# Patient Record
Sex: Male | Born: 1950 | Race: Black or African American | Hispanic: No
Health system: Southern US, Community
[De-identification: ages and names within clinical notes are randomized; demographics above are authoritative.]

---

## 2005-08-20 ENCOUNTER — Emergency Department: Payer: Self-pay | Admitting: Emergency Medicine

## 2005-08-25 ENCOUNTER — Emergency Department: Payer: Self-pay | Admitting: Emergency Medicine

## 2007-06-01 ENCOUNTER — Emergency Department: Payer: Self-pay | Admitting: Emergency Medicine

## 2007-12-24 ENCOUNTER — Emergency Department (HOSPITAL_COMMUNITY): Admission: EM | Admit: 2007-12-24 | Discharge: 2007-12-24 | Payer: Self-pay | Admitting: Emergency Medicine

## 2008-01-03 ENCOUNTER — Emergency Department (HOSPITAL_COMMUNITY): Admission: EM | Admit: 2008-01-03 | Discharge: 2008-01-03 | Payer: Self-pay | Admitting: Family Medicine

## 2008-01-06 ENCOUNTER — Emergency Department (HOSPITAL_COMMUNITY): Admission: EM | Admit: 2008-01-06 | Discharge: 2008-01-06 | Payer: Self-pay | Admitting: Family Medicine

## 2008-01-09 ENCOUNTER — Emergency Department (HOSPITAL_COMMUNITY): Admission: EM | Admit: 2008-01-09 | Discharge: 2008-01-09 | Payer: Self-pay | Admitting: Family Medicine

## 2008-01-11 ENCOUNTER — Emergency Department (HOSPITAL_COMMUNITY): Admission: EM | Admit: 2008-01-11 | Discharge: 2008-01-11 | Payer: Self-pay | Admitting: Family Medicine

## 2010-08-15 ENCOUNTER — Inpatient Hospital Stay: Payer: Self-pay | Admitting: Specialist

## 2010-08-25 ENCOUNTER — Observation Stay: Payer: Self-pay | Admitting: Internal Medicine

## 2010-09-07 ENCOUNTER — Observation Stay: Payer: Self-pay | Admitting: Cardiology

## 2010-11-16 ENCOUNTER — Observation Stay: Payer: Self-pay | Admitting: Internal Medicine

## 2011-07-14 ENCOUNTER — Emergency Department: Payer: Self-pay | Admitting: Emergency Medicine

## 2011-12-13 ENCOUNTER — Emergency Department: Payer: Self-pay | Admitting: Emergency Medicine

## 2011-12-13 LAB — COMPREHENSIVE METABOLIC PANEL
Albumin: 2.3 g/dL — ABNORMAL LOW (ref 3.4–5.0)
Alkaline Phosphatase: 147 U/L — ABNORMAL HIGH (ref 50–136)
Anion Gap: 7 (ref 7–16)
BUN: 1 mg/dL — ABNORMAL LOW (ref 7–18)
Calcium, Total: 8.6 mg/dL (ref 8.5–10.1)
Creatinine: 0.8 mg/dL (ref 0.60–1.30)
EGFR (Non-African Amer.): >60
Glucose: 104 mg/dL — ABNORMAL HIGH (ref 65–99)
Osmolality: 264 (ref 275–301)
Potassium: 3.5 mmol/L (ref 3.5–5.1)
Sodium: 134 mmol/L — ABNORMAL LOW (ref 136–145)
Total Protein: 10.4 g/dL — ABNORMAL HIGH (ref 6.4–8.2)

## 2011-12-13 LAB — CBC
HCT: 41.9 % (ref 40.0–52.0)
HGB: 14.4 g/dL (ref 13.0–18.0)
MCHC: 34.3 g/dL (ref 32.0–36.0)
RBC: 3.86 10*6/uL — ABNORMAL LOW (ref 4.40–5.90)

## 2011-12-13 LAB — URINALYSIS, COMPLETE
Nitrite: NEGATIVE
Ph: 6 (ref 4.5–8.0)
Protein: NEGATIVE
Specific Gravity: 1.057 (ref 1.003–1.030)
WBC UR: 4 /HPF (ref 0–5)

## 2011-12-16 ENCOUNTER — Ambulatory Visit: Payer: Self-pay | Admitting: Gastroenterology

## 2012-01-28 IMAGING — CR DG CHEST 1V PORT
1 series · 1 of 1 positions shown · non-contrast
Comparison: none

REASON FOR EXAM: chest pain
COMMENTS:

[view not recorded]
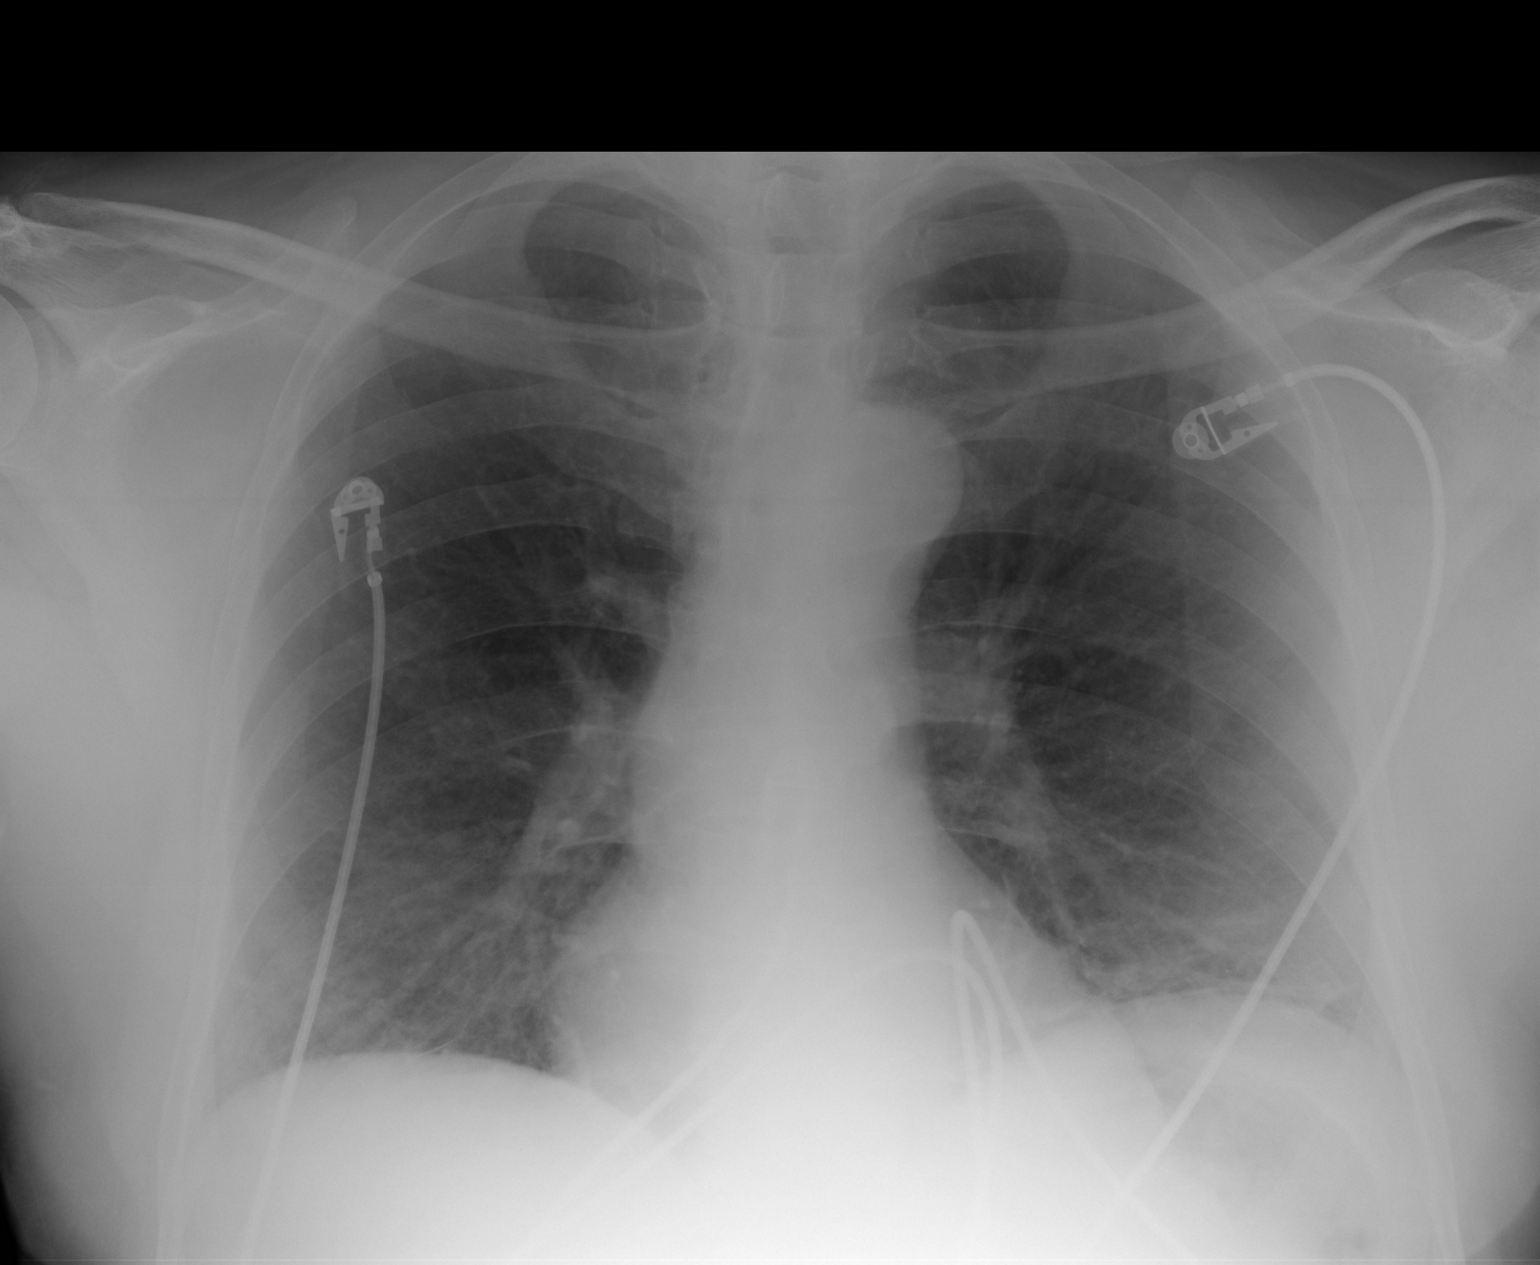

[1 of 1 positions shown; findings below may reference images not displayed]

PROCEDURE:     DXR - DXR PORTABLE CHEST SINGLE VIEW  - August 25, 2010  [DATE]

RESULT:     Comparison is made to the prior exam of 08/15/2010. There is
again noted a slight increase in density at the left base, consistent with
left basilar atelectasis. The lung fields otherwise are clear. Heart size is
normal. No pulmonary edema or pleural effusion is seen. Monitoring
electrodes are present.
IMPRESSION: There is minimal left basilar atelectasis. The lung fields otherwise are
clear.

## 2012-02-10 IMAGING — CR DG CHEST 2V
1 series · 3 of 3 positions shown · non-contrast
Comparison: none

REASON FOR EXAM: chest pain
COMMENTS:

[Series 1: view not recorded · 0.17mm/px · 3 of 3 slices shown]
[im 1/3]
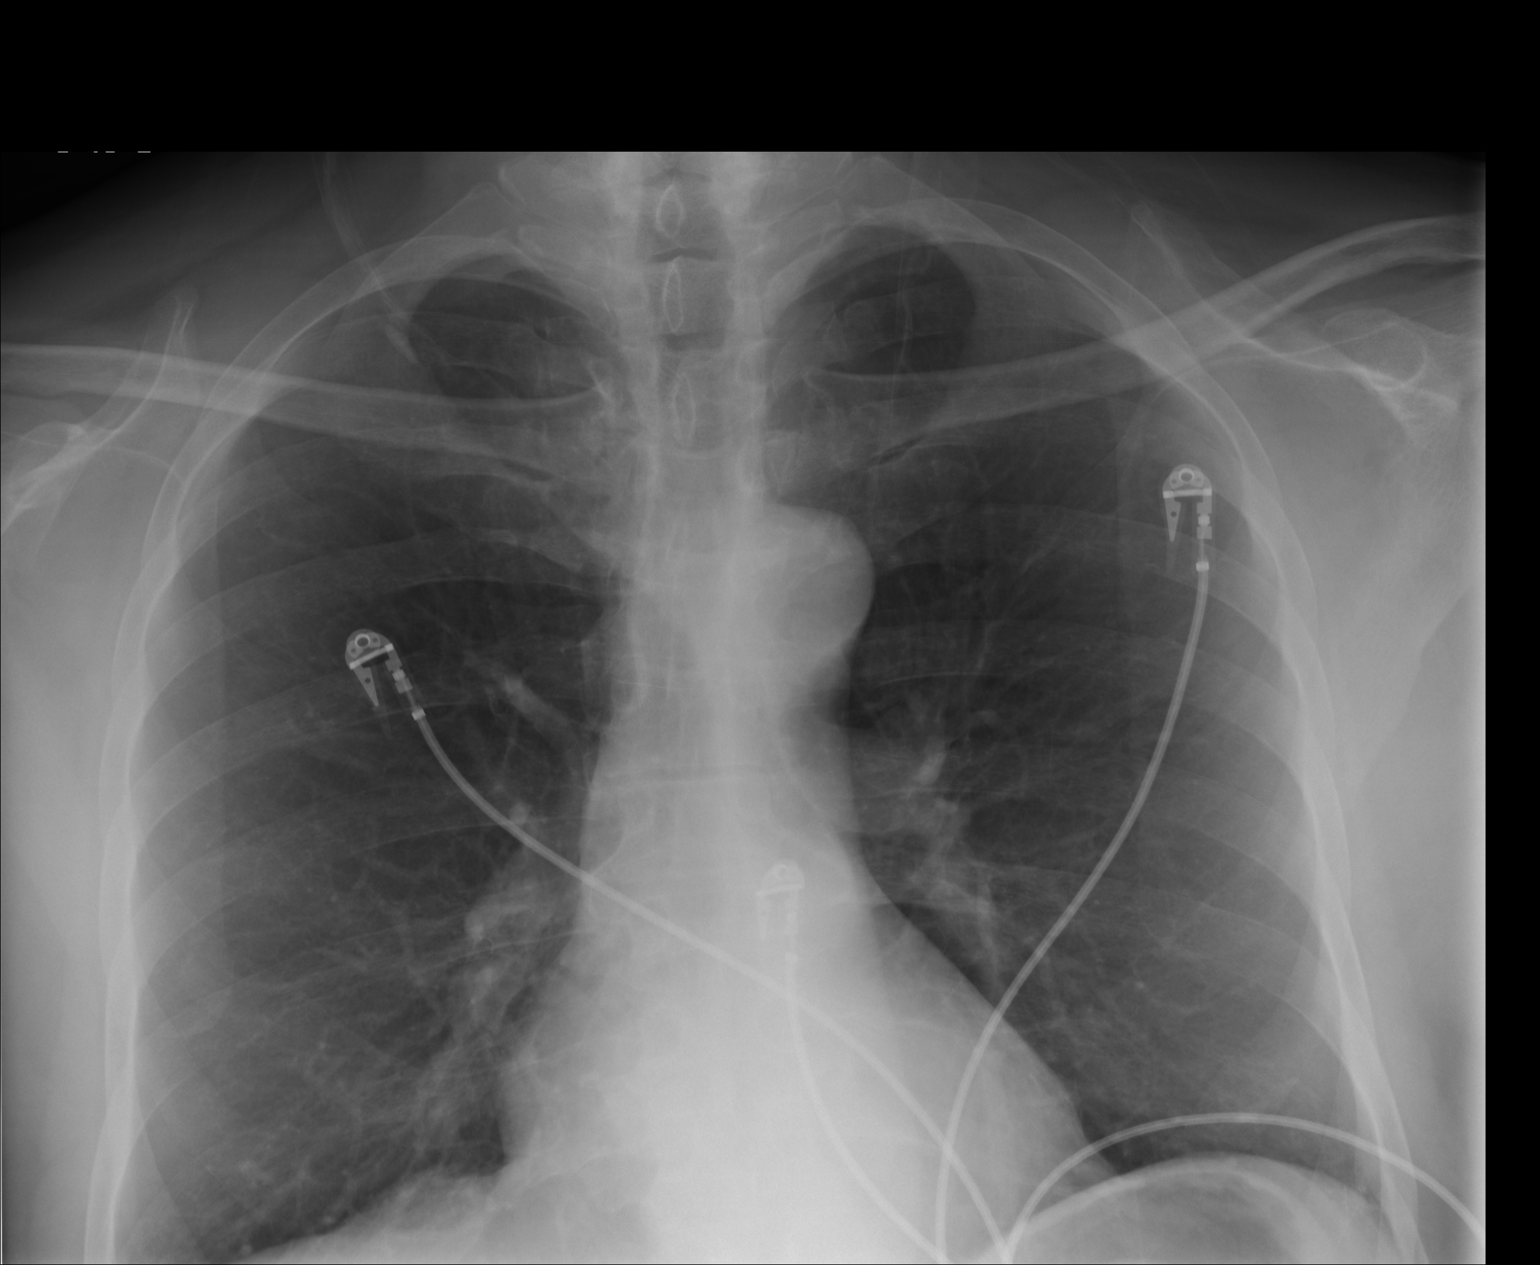
[im 2/3]
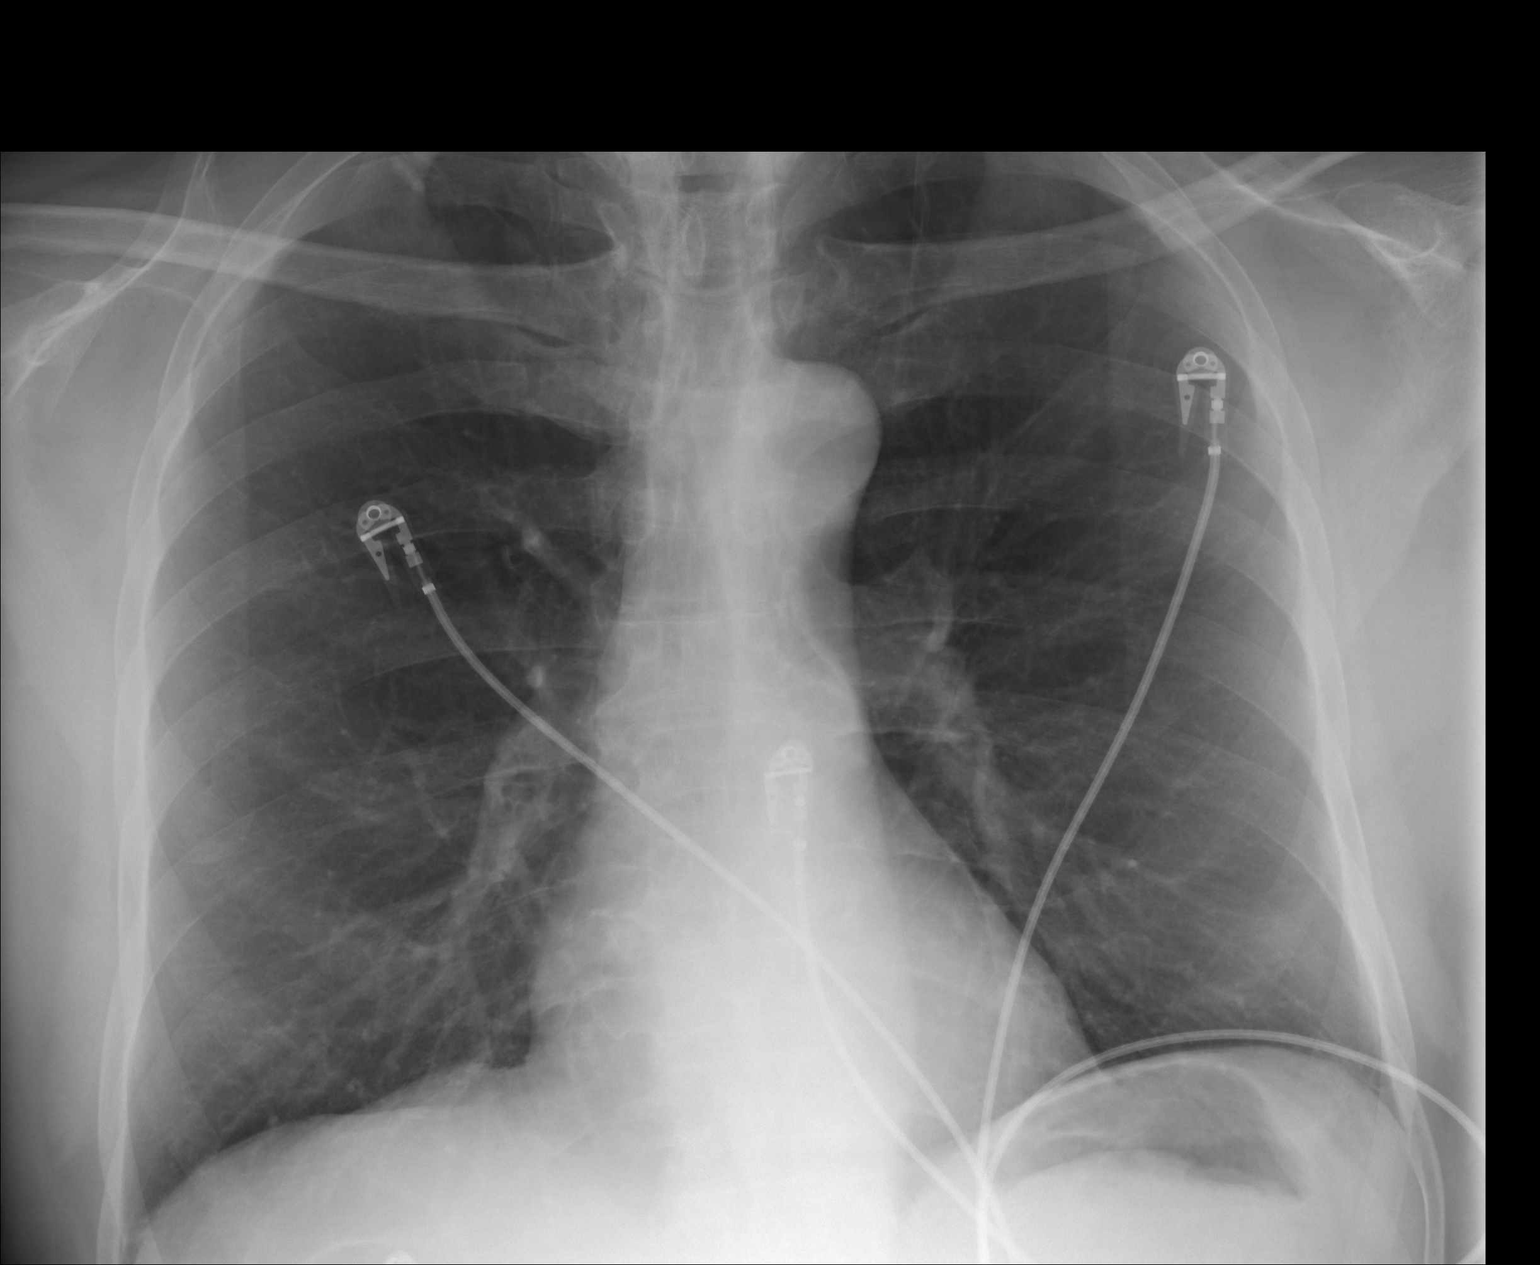
[im 3/3]
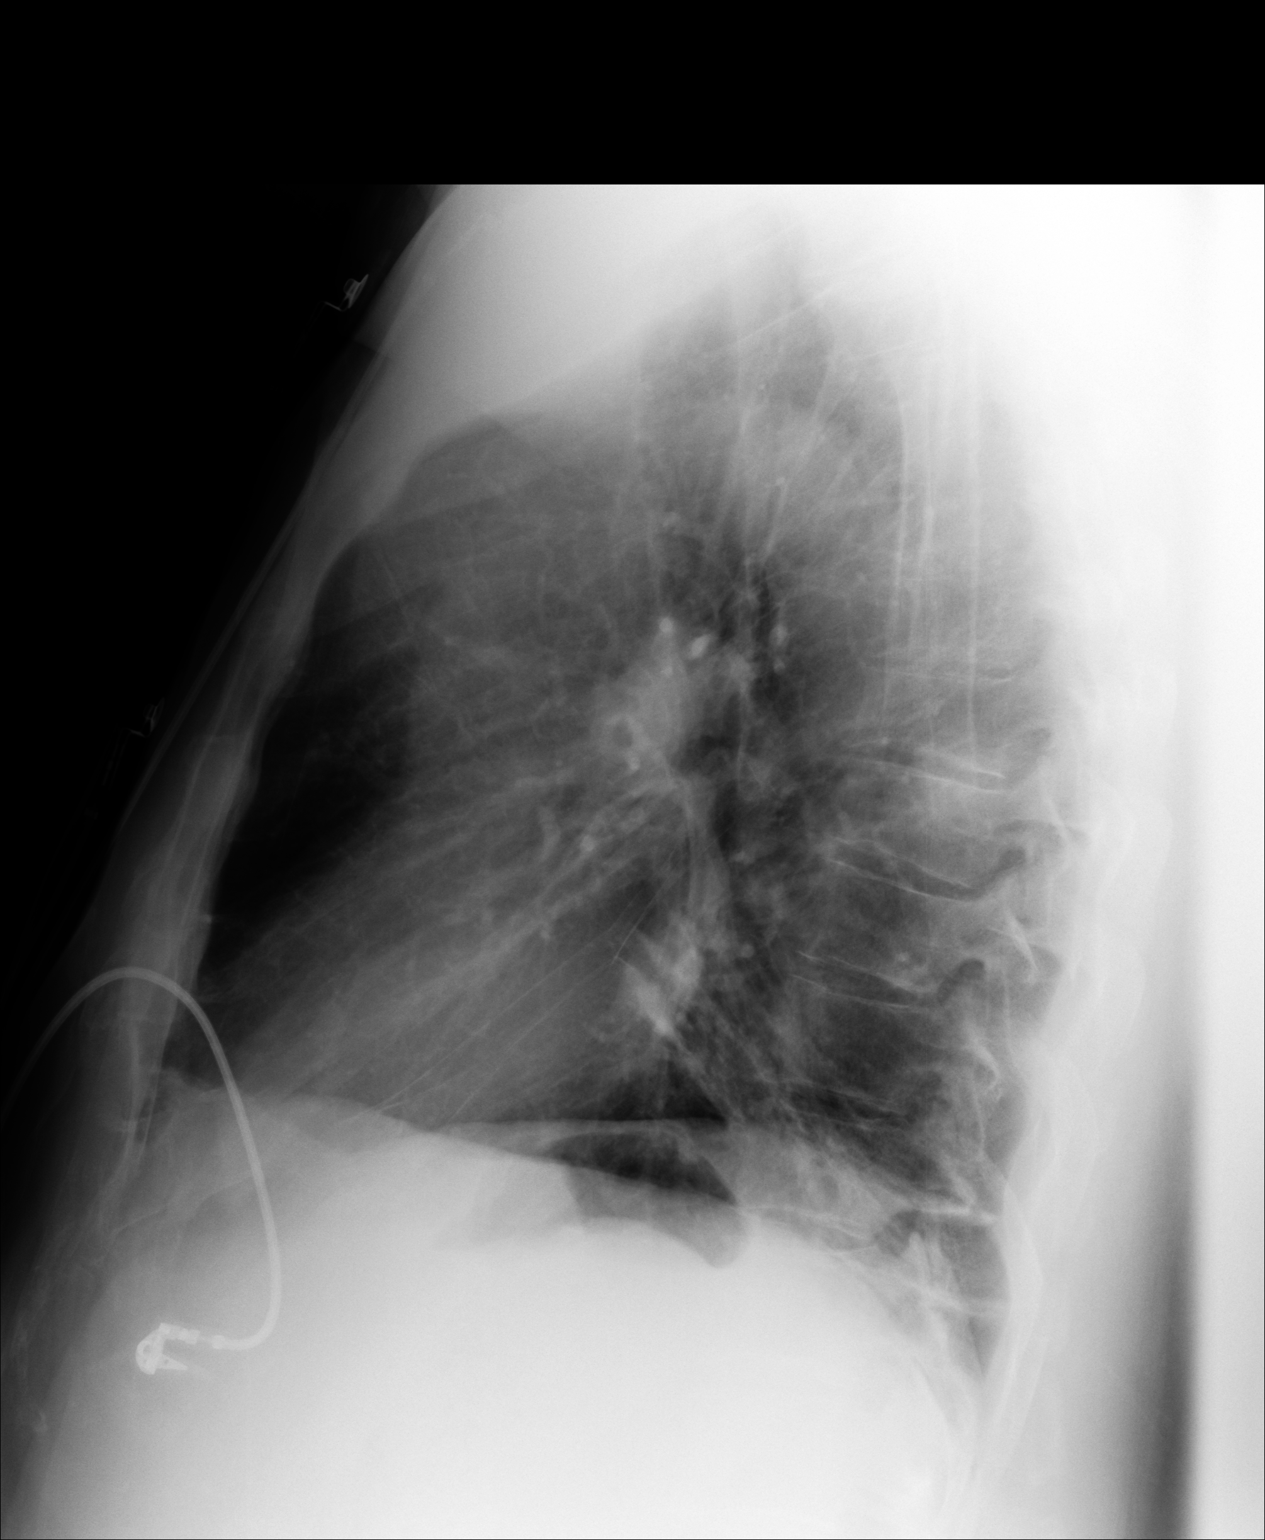

[3 of 3 positions shown; findings below may reference images not displayed]

PROCEDURE:     DXR - DXR CHEST PA (OR AP) AND LATERAL  - September 07, 2010  [DATE]

RESULT:     Comparison is made to the study 25 August, 2010.

The lungs are well-expanded. There are coarse lung markings in the
retrocardiac region on the lateral film. These are difficult to triangulate
on the frontal film. The cardiac silhouette is normal in size. The pulmonary
vascularity is not engorged. The mediastinum is normal in width.
IMPRESSION: 1. I do not see evidence of CHF.
2. There is atelectasis versus scarring in the retro-cardiac region which
was shown to be predominantly left-sided on the CT scan dated 25 August, 2010.

## 2012-02-21 ENCOUNTER — Inpatient Hospital Stay: Payer: Self-pay | Admitting: Internal Medicine

## 2012-02-21 LAB — COMPREHENSIVE METABOLIC PANEL
Alkaline Phosphatase: 104 U/L (ref 50–136)
Anion Gap: 11 (ref 7–16)
Bilirubin,Total: 4.2 mg/dL — ABNORMAL HIGH (ref 0.2–1.0)
Calcium, Total: 8.5 mg/dL (ref 8.5–10.1)
Chloride: 97 mmol/L — ABNORMAL LOW (ref 98–107)
Co2: 21 mmol/L (ref 21–32)
Creatinine: 0.9 mg/dL (ref 0.60–1.30)
EGFR (Non-African Amer.): >60
Osmolality: 259 (ref 275–301)
Potassium: 4.2 mmol/L (ref 3.5–5.1)
Sodium: 129 mmol/L — ABNORMAL LOW (ref 136–145)

## 2012-02-21 LAB — PROTIME-INR: Prothrombin Time: 28 secs — ABNORMAL HIGH (ref 11.5–14.7)

## 2012-02-21 LAB — APTT: Activated PTT: 55.8 secs — ABNORMAL HIGH (ref 23.6–35.9)

## 2012-02-21 LAB — CBC
HCT: 31.4 % — ABNORMAL LOW (ref 40.0–52.0)
HGB: 10.8 g/dL — ABNORMAL LOW (ref 13.0–18.0)
MCV: 107 fL — ABNORMAL HIGH (ref 80–100)
RBC: 2.93 10*6/uL — ABNORMAL LOW (ref 4.40–5.90)

## 2012-02-21 LAB — AMMONIA: Ammonia, Plasma: 29 mcmol/L (ref 11–32)

## 2012-02-21 LAB — CK TOTAL AND CKMB (NOT AT ARMC): CK, Total: 20 U/L — ABNORMAL LOW (ref 35–232)

## 2012-02-22 LAB — CBC WITH DIFFERENTIAL/PLATELET
Comment - H1-Com2: NORMAL
Eosinophil %: 0 %
HCT: 23.6 % — ABNORMAL LOW (ref 40.0–52.0)
HGB: 8.3 g/dL — ABNORMAL LOW (ref 13.0–18.0)
Lymphocyte #: 2.4 10*3/uL (ref 1.0–3.6)
Lymphocytes: 4 %
MCH: 37.1 pg — ABNORMAL HIGH (ref 26.0–34.0)
MCH: 36 pg — ABNORMAL HIGH (ref 26.0–34.0)
MCHC: 34.6 g/dL (ref 32.0–36.0)
Monocyte #: 1.4 x10 3/mm — ABNORMAL HIGH (ref 0.2–1.0)
Neutrophil #: 15 10*3/uL — ABNORMAL HIGH (ref 1.4–6.5)
Platelet: 270 10*3/uL (ref 150–440)
RDW: 14.4 % (ref 11.5–14.5)
WBC: 19 10*3/uL — ABNORMAL HIGH (ref 3.8–10.6)

## 2012-02-22 LAB — URINALYSIS, COMPLETE
Bilirubin,UR: NEGATIVE
Blood: NEGATIVE
Glucose,UR: NEGATIVE mg/dL (ref 0–75)
Specific Gravity: 1.032 (ref 1.003–1.030)

## 2012-02-22 LAB — BASIC METABOLIC PANEL
Anion Gap: 9 (ref 7–16)
Creatinine: 0.94 mg/dL (ref 0.60–1.30)
EGFR (African American): >60
EGFR (Non-African Amer.): >60

## 2012-02-24 ENCOUNTER — Ambulatory Visit: Payer: Self-pay | Admitting: Internal Medicine

## 2012-02-24 LAB — BASIC METABOLIC PANEL
BUN: 13 mg/dL (ref 7–18)
EGFR (African American): >60
EGFR (Non-African Amer.): >60
Glucose: 115 mg/dL — ABNORMAL HIGH (ref 65–99)
Osmolality: 262 (ref 275–301)
Potassium: 4.1 mmol/L (ref 3.5–5.1)

## 2012-02-24 LAB — CBC WITH DIFFERENTIAL/PLATELET
Basophil #: 0.1 10*3/uL (ref 0.0–0.1)
Eosinophil #: 0 10*3/uL (ref 0.0–0.7)
HGB: 5.6 g/dL — ABNORMAL LOW (ref 13.0–18.0)
Lymphocyte %: 19.9 %
MCH: 35.3 pg — ABNORMAL HIGH (ref 26.0–34.0)
Neutrophil #: 15.5 10*3/uL — ABNORMAL HIGH (ref 1.4–6.5)
Neutrophil %: 68.5 %
RBC: 1.6 10*6/uL — ABNORMAL LOW (ref 4.40–5.90)
WBC: 22.6 10*3/uL — ABNORMAL HIGH (ref 3.8–10.6)

## 2012-02-25 LAB — CBC WITH DIFFERENTIAL/PLATELET
Basophil #: 0.2 10*3/uL — ABNORMAL HIGH (ref 0.0–0.1)
Basophil %: 1.1 %
Eosinophil #: 0.1 10*3/uL (ref 0.0–0.7)
HCT: 21.9 % — ABNORMAL LOW (ref 40.0–52.0)
HGB: 7.7 g/dL — ABNORMAL LOW (ref 13.0–18.0)
Lymphocyte #: 4.2 10*3/uL — ABNORMAL HIGH (ref 1.0–3.6)
Lymphocyte %: 19.5 %
MCH: 35 pg — ABNORMAL HIGH (ref 26.0–34.0)
MCHC: 35.2 g/dL (ref 32.0–36.0)
MCV: 99 fL (ref 80–100)
Neutrophil #: 14.2 10*3/uL — ABNORMAL HIGH (ref 1.4–6.5)
RBC: 2.2 10*6/uL — ABNORMAL LOW (ref 4.40–5.90)
RDW: 18.3 % — ABNORMAL HIGH (ref 11.5–14.5)
WBC: 21.6 10*3/uL — ABNORMAL HIGH (ref 3.8–10.6)

## 2012-02-28 LAB — BASIC METABOLIC PANEL
BUN: 5 mg/dL — ABNORMAL LOW (ref 7–18)
Creatinine: 0.62 mg/dL (ref 0.60–1.30)
EGFR (African American): >60
EGFR (Non-African Amer.): >60
Glucose: 95 mg/dL (ref 65–99)
Sodium: 124 mmol/L — ABNORMAL LOW (ref 136–145)

## 2012-02-28 LAB — CBC WITH DIFFERENTIAL/PLATELET
Basophil: 1 %
Eosinophil: 2 %
HCT: 22.6 % — ABNORMAL LOW (ref 40.0–52.0)
HGB: 8.3 g/dL — ABNORMAL LOW (ref 13.0–18.0)
MCH: 36.7 pg — ABNORMAL HIGH (ref 26.0–34.0)
MCH: 36.3 pg — ABNORMAL HIGH (ref 26.0–34.0)
MCHC: 35.3 g/dL (ref 32.0–36.0)
MCHC: 34.1 g/dL (ref 32.0–36.0)
MCV: 104 fL — ABNORMAL HIGH (ref 80–100)
Monocytes: 6 %
Monocytes: 5 %
Myelocyte: 2 %
Platelet: 189 10*3/uL (ref 150–440)
Platelet: 208 10*3/uL (ref 150–440)
RDW: 18.1 % — ABNORMAL HIGH (ref 11.5–14.5)
RDW: 19.7 % — ABNORMAL HIGH (ref 11.5–14.5)
WBC: 17.1 10*3/uL — ABNORMAL HIGH (ref 3.8–10.6)

## 2012-02-28 LAB — COMPREHENSIVE METABOLIC PANEL
BUN: 6 mg/dL — ABNORMAL LOW (ref 7–18)
Bilirubin,Total: 4.8 mg/dL — ABNORMAL HIGH (ref 0.2–1.0)
Chloride: 94 mmol/L — ABNORMAL LOW (ref 98–107)
Creatinine: 0.63 mg/dL (ref 0.60–1.30)
EGFR (African American): >60
Osmolality: 250 (ref 275–301)
SGPT (ALT): 21 U/L (ref 12–78)
Sodium: 126 mmol/L — ABNORMAL LOW (ref 136–145)
Total Protein: 7.9 g/dL (ref 6.4–8.2)

## 2012-02-28 LAB — HEPATIC FUNCTION PANEL A (ARMC)
Albumin: 1.5 g/dL — ABNORMAL LOW (ref 3.4–5.0)
Alkaline Phosphatase: 97 U/L (ref 50–136)
Bilirubin, Direct: 1.8 mg/dL — ABNORMAL HIGH (ref 0.00–0.20)
Bilirubin,Total: 4.9 mg/dL — ABNORMAL HIGH (ref 0.2–1.0)
SGPT (ALT): 20 U/L (ref 12–78)

## 2012-02-28 LAB — UR PROT ELECTROPHORESIS, URINE RANDOM

## 2012-02-28 LAB — PROTIME-INR: INR: 2.5

## 2012-03-18 ENCOUNTER — Ambulatory Visit: Payer: Self-pay | Admitting: Internal Medicine

## 2012-05-12 ENCOUNTER — Inpatient Hospital Stay: Payer: Self-pay | Admitting: Internal Medicine

## 2012-05-12 LAB — URINALYSIS, COMPLETE
Blood: NEGATIVE
Nitrite: POSITIVE

## 2012-05-12 LAB — COMPREHENSIVE METABOLIC PANEL
BUN: 6 mg/dL — ABNORMAL LOW (ref 7–18)
Bilirubin,Total: 2 mg/dL — ABNORMAL HIGH (ref 0.2–1.0)
Co2: 23 mmol/L (ref 21–32)
EGFR (Non-African Amer.): >60
Total Protein: 10 g/dL — ABNORMAL HIGH (ref 6.4–8.2)

## 2012-05-12 LAB — LIPASE, BLOOD: Lipase: 383 U/L (ref 73–393)

## 2012-05-12 LAB — CBC WITH DIFFERENTIAL/PLATELET
HGB: 11 g/dL — ABNORMAL LOW (ref 13.0–18.0)
Lymphocyte %: 37.2 %
MCV: 102 fL — ABNORMAL HIGH (ref 80–100)
Monocyte %: 11 %
WBC: 6.7 10*3/uL (ref 3.8–10.6)

## 2012-05-12 LAB — CBC
HCT: 37.2 % — ABNORMAL LOW (ref 40.0–52.0)
HGB: 12.9 g/dL — ABNORMAL LOW (ref 13.0–18.0)
MCHC: 34.8 g/dL (ref 32.0–36.0)
MCV: 102 fL — ABNORMAL HIGH (ref 80–100)
RBC: 3.66 10*6/uL — ABNORMAL LOW (ref 4.40–5.90)
WBC: 6.8 10*3/uL (ref 3.8–10.6)

## 2012-05-13 LAB — CBC WITH DIFFERENTIAL/PLATELET
Basophil %: 1 %
Lymphocyte #: 2.3 10*3/uL (ref 1.0–3.6)
Lymphocyte %: 36.2 %
Lymphocytes: 32 %
MCHC: 35.1 g/dL (ref 32.0–36.0)
MCHC: 35.1 g/dL (ref 32.0–36.0)
MCV: 102 fL — ABNORMAL HIGH (ref 80–100)
Neutrophil #: 3.1 10*3/uL (ref 1.4–6.5)
Neutrophil %: 48.2 %
Platelet: 180 10*3/uL (ref 150–440)
RBC: 2.87 10*6/uL — ABNORMAL LOW (ref 4.40–5.90)
RBC: 3.05 10*6/uL — ABNORMAL LOW (ref 4.40–5.90)
WBC: 6.5 10*3/uL (ref 3.8–10.6)

## 2012-05-13 LAB — BASIC METABOLIC PANEL
Anion Gap: 6 — ABNORMAL LOW (ref 7–16)
Calcium, Total: 7.5 mg/dL — ABNORMAL LOW (ref 8.5–10.1)
Chloride: 103 mmol/L (ref 98–107)
Co2: 22 mmol/L (ref 21–32)
Creatinine: 0.81 mg/dL (ref 0.60–1.30)
EGFR (Non-African Amer.): >60
Osmolality: 261 (ref 275–301)

## 2012-05-14 LAB — CBC WITH DIFFERENTIAL/PLATELET
Eosinophil #: 0.1 10*3/uL (ref 0.0–0.7)
HCT: 28.4 % — ABNORMAL LOW (ref 40.0–52.0)
HGB: 9.9 g/dL — ABNORMAL LOW (ref 13.0–18.0)
Lymphocyte #: 2.3 10*3/uL (ref 1.0–3.6)
MCH: 35.1 pg — ABNORMAL HIGH (ref 26.0–34.0)
MCHC: 34.7 g/dL (ref 32.0–36.0)
MCV: 101 fL — ABNORMAL HIGH (ref 80–100)
WBC: 7.1 10*3/uL (ref 3.8–10.6)

## 2012-07-17 ENCOUNTER — Ambulatory Visit: Payer: Self-pay | Admitting: Internal Medicine

## 2012-08-04 ENCOUNTER — Emergency Department: Payer: Self-pay | Admitting: Internal Medicine

## 2012-08-04 LAB — COMPREHENSIVE METABOLIC PANEL
Albumin: 1.4 g/dL — ABNORMAL LOW (ref 3.4–5.0)
Bilirubin,Total: 1.6 mg/dL — ABNORMAL HIGH (ref 0.2–1.0)
Calcium, Total: 6.7 mg/dL — CL (ref 8.5–10.1)
Co2: 22 mmol/L (ref 21–32)
Creatinine: 0.92 mg/dL (ref 0.60–1.30)
EGFR (African American): >60
EGFR (Non-African Amer.): >60
Glucose: 122 mg/dL — ABNORMAL HIGH (ref 65–99)
Osmolality: 263 (ref 275–301)
Potassium: 4.1 mmol/L (ref 3.5–5.1)
SGPT (ALT): 28 U/L (ref 12–78)
Sodium: 131 mmol/L — ABNORMAL LOW (ref 136–145)
Total Protein: 8.2 g/dL (ref 6.4–8.2)

## 2012-08-04 LAB — PROTIME-INR: Prothrombin Time: 21.2 secs — ABNORMAL HIGH (ref 11.5–14.7)

## 2012-08-04 LAB — CBC
HCT: 29.9 % — ABNORMAL LOW (ref 40.0–52.0)
HGB: 10.5 g/dL — ABNORMAL LOW (ref 13.0–18.0)
MCH: 36.4 pg — ABNORMAL HIGH (ref 26.0–34.0)
MCHC: 34.9 g/dL (ref 32.0–36.0)
MCV: 104 fL — ABNORMAL HIGH (ref 80–100)
WBC: 6.9 10*3/uL (ref 3.8–10.6)

## 2012-08-08 ENCOUNTER — Inpatient Hospital Stay: Payer: Self-pay | Admitting: Internal Medicine

## 2012-08-08 LAB — CBC
HCT: 27.1 % — ABNORMAL LOW (ref 40.0–52.0)
HGB: 9 g/dL — ABNORMAL LOW (ref 13.0–18.0)
MCH: 34.6 pg — ABNORMAL HIGH (ref 26.0–34.0)
MCHC: 33.2 g/dL (ref 32.0–36.0)
MCV: 104 fL — ABNORMAL HIGH (ref 80–100)
RBC: 2.59 10*6/uL — ABNORMAL LOW (ref 4.40–5.90)

## 2012-08-08 LAB — HEMOGLOBIN: HGB: 9.7 g/dL — ABNORMAL LOW (ref 13.0–18.0)

## 2012-08-08 LAB — COMPREHENSIVE METABOLIC PANEL
Albumin: 1.4 g/dL — ABNORMAL LOW (ref 3.4–5.0)
Alkaline Phosphatase: 91 U/L (ref 50–136)
BUN: 10 mg/dL (ref 7–18)
Calcium, Total: 7.4 mg/dL — ABNORMAL LOW (ref 8.5–10.1)
Chloride: 102 mmol/L (ref 98–107)
Creatinine: 0.96 mg/dL (ref 0.60–1.30)
Osmolality: 261 (ref 275–301)
Potassium: 4.4 mmol/L (ref 3.5–5.1)
Sodium: 130 mmol/L — ABNORMAL LOW (ref 136–145)
Total Protein: 7.7 g/dL (ref 6.4–8.2)

## 2012-08-08 LAB — APTT: Activated PTT: 49.6 secs — ABNORMAL HIGH (ref 23.6–35.9)

## 2012-08-08 LAB — PROTIME-INR
INR: 2.3
Prothrombin Time: 24.9 secs — ABNORMAL HIGH (ref 11.5–14.7)

## 2012-08-09 LAB — CBC WITH DIFFERENTIAL/PLATELET
Basophil: 1 %
Eosinophil: 1 %
HCT: 25.7 % — ABNORMAL LOW (ref 40.0–52.0)
HGB: 8.9 g/dL — ABNORMAL LOW (ref 13.0–18.0)
Lymphocytes: 13 %
MCH: 36 pg — ABNORMAL HIGH (ref 26.0–34.0)
MCV: 104 fL — ABNORMAL HIGH (ref 80–100)
Monocytes: 8 %
Myelocyte: 1 %
RBC: 2.46 10*6/uL — ABNORMAL LOW (ref 4.40–5.90)
RDW: 15.4 % — ABNORMAL HIGH (ref 11.5–14.5)
Segmented Neutrophils: 75 %

## 2012-08-09 LAB — BASIC METABOLIC PANEL
Anion Gap: 7 (ref 7–16)
Chloride: 102 mmol/L (ref 98–107)
Co2: 23 mmol/L (ref 21–32)
Creatinine: 1.18 mg/dL (ref 0.60–1.30)
EGFR (African American): >60
EGFR (Non-African Amer.): >60
Osmolality: 265 (ref 275–301)
Potassium: 4.3 mmol/L (ref 3.5–5.1)
Sodium: 132 mmol/L — ABNORMAL LOW (ref 136–145)

## 2012-08-09 LAB — PROTIME-INR
INR: 2.2
Prothrombin Time: 24.1 secs — ABNORMAL HIGH (ref 11.5–14.7)

## 2012-08-09 LAB — HEMOGLOBIN: HGB: 9.2 g/dL — ABNORMAL LOW (ref 13.0–18.0)

## 2012-08-10 LAB — GLUCOSE, SEROUS FLUID: Glucose, Body Fluid: 123 mg/dL

## 2012-08-10 LAB — BASIC METABOLIC PANEL
BUN: 14 mg/dL (ref 7–18)
Calcium, Total: 7.8 mg/dL — ABNORMAL LOW (ref 8.5–10.1)
Creatinine: 1.12 mg/dL (ref 0.60–1.30)
EGFR (African American): >60
EGFR (Non-African Amer.): >60
Glucose: 106 mg/dL — ABNORMAL HIGH (ref 65–99)
Osmolality: 267 (ref 275–301)
Potassium: 4.6 mmol/L (ref 3.5–5.1)
Sodium: 133 mmol/L — ABNORMAL LOW (ref 136–145)

## 2012-08-10 LAB — SYNOVIAL CELL COUNT + DIFF, W/ CRYSTALS
Basophil: 0 %
Nucleated Cell Count: 57 /mm3
Other Mononuclear Cells: 58 %

## 2012-08-10 LAB — PROTEIN, BODY FLUID: Protein, Body Fluid: 1.5 g/dL

## 2012-08-10 LAB — LACTATE DEHYDROGENASE, PLEURAL OR PERITONEAL FLUID: LDH, Body Fluid: 34 U/L

## 2012-08-10 LAB — MAGNESIUM: Magnesium: 1.7 mg/dL — ABNORMAL LOW

## 2012-08-10 LAB — HEMOGLOBIN: HGB: 11.9 g/dL — ABNORMAL LOW (ref 13.0–18.0)

## 2012-08-11 LAB — CBC WITH DIFFERENTIAL/PLATELET
Basophil #: 0.1 10*3/uL (ref 0.0–0.1)
Eosinophil #: 0 10*3/uL (ref 0.0–0.7)
Eosinophil %: 0.3 %
HCT: 27.9 % — ABNORMAL LOW (ref 40.0–52.0)
HGB: 9.5 g/dL — ABNORMAL LOW (ref 13.0–18.0)
Lymphocyte %: 16.3 %
MCH: 34 pg (ref 26.0–34.0)
MCHC: 34 g/dL (ref 32.0–36.0)
MCV: 100 fL (ref 80–100)
Monocyte #: 1.2 x10 3/mm — ABNORMAL HIGH (ref 0.2–1.0)
Monocyte %: 9 %
Neutrophil #: 9.5 10*3/uL — ABNORMAL HIGH (ref 1.4–6.5)
Platelet: 193 10*3/uL (ref 150–440)
WBC: 13 10*3/uL — ABNORMAL HIGH (ref 3.8–10.6)

## 2012-08-11 LAB — PROTIME-INR
INR: 1.8
Prothrombin Time: 21 secs — ABNORMAL HIGH (ref 11.5–14.7)

## 2012-08-12 LAB — CBC WITH DIFFERENTIAL/PLATELET
Eosinophil #: 0.1 10*3/uL (ref 0.0–0.7)
Lymphocyte #: 2.3 10*3/uL (ref 1.0–3.6)
MCHC: 34.1 g/dL (ref 32.0–36.0)
MCV: 101 fL — ABNORMAL HIGH (ref 80–100)
Monocyte #: 1.3 x10 3/mm — ABNORMAL HIGH (ref 0.2–1.0)
Monocyte %: 11.3 %
Neutrophil #: 7.7 10*3/uL — ABNORMAL HIGH (ref 1.4–6.5)
Platelet: 189 10*3/uL (ref 150–440)
RDW: 16 % — ABNORMAL HIGH (ref 11.5–14.5)
WBC: 11.4 10*3/uL — ABNORMAL HIGH (ref 3.8–10.6)

## 2012-08-12 LAB — PROTIME-INR: Prothrombin Time: 22.7 secs — ABNORMAL HIGH (ref 11.5–14.7)

## 2012-08-13 LAB — CBC WITH DIFFERENTIAL/PLATELET
Bands: 2 %
Basophil: 1 %
Eosinophil: 1 %
HCT: 31.1 % — ABNORMAL LOW (ref 40.0–52.0)
Lymphocytes: 17 %
MCH: 35.3 pg — ABNORMAL HIGH (ref 26.0–34.0)
MCV: 101 fL — ABNORMAL HIGH (ref 80–100)
Monocytes: 7 %
Platelet: 176 10*3/uL (ref 150–440)
RBC: 3.09 10*6/uL — ABNORMAL LOW (ref 4.40–5.90)
RDW: 15.8 % — ABNORMAL HIGH (ref 11.5–14.5)
Segmented Neutrophils: 71 %

## 2012-08-13 LAB — MAGNESIUM: Magnesium: 1.5 mg/dL — ABNORMAL LOW

## 2012-08-13 LAB — APTT: Activated PTT: 54.8 secs — ABNORMAL HIGH (ref 23.6–35.9)

## 2012-08-13 LAB — BASIC METABOLIC PANEL
Anion Gap: 8 (ref 7–16)
BUN: 11 mg/dL (ref 7–18)
Chloride: 101 mmol/L (ref 98–107)
EGFR (African American): >60
Glucose: 112 mg/dL — ABNORMAL HIGH (ref 65–99)
Potassium: 3.9 mmol/L (ref 3.5–5.1)
Sodium: 132 mmol/L — ABNORMAL LOW (ref 136–145)

## 2012-08-13 LAB — PROTIME-INR: Prothrombin Time: 23.8 secs — ABNORMAL HIGH (ref 11.5–14.7)

## 2012-08-14 LAB — PROTIME-INR
INR: 2.3
Prothrombin Time: 24.7 secs — ABNORMAL HIGH (ref 11.5–14.7)
Prothrombin Time: 23.4 secs — ABNORMAL HIGH (ref 11.5–14.7)

## 2012-08-14 LAB — MAGNESIUM: Magnesium: 1.8 mg/dL

## 2012-08-15 LAB — PROTIME-INR
INR: 1.9
Prothrombin Time: 21.3 secs — ABNORMAL HIGH (ref 11.5–14.7)

## 2012-08-15 LAB — BASIC METABOLIC PANEL
Anion Gap: 5 — ABNORMAL LOW (ref 7–16)
BUN: 11 mg/dL (ref 7–18)
Calcium, Total: 7.4 mg/dL — ABNORMAL LOW (ref 8.5–10.1)
Chloride: 101 mmol/L (ref 98–107)
Co2: 26 mmol/L (ref 21–32)
Creatinine: 0.94 mg/dL (ref 0.60–1.30)
Glucose: 128 mg/dL — ABNORMAL HIGH (ref 65–99)
Osmolality: 266 (ref 275–301)

## 2012-08-16 ENCOUNTER — Ambulatory Visit: Payer: Self-pay | Admitting: Internal Medicine

## 2012-08-16 LAB — APTT: Activated PTT: 50.6 secs — ABNORMAL HIGH (ref 23.6–35.9)

## 2012-08-16 LAB — PROTIME-INR: Prothrombin Time: 25.6 secs — ABNORMAL HIGH (ref 11.5–14.7)

## 2012-09-04 ENCOUNTER — Observation Stay: Payer: Self-pay | Admitting: Internal Medicine

## 2012-09-04 LAB — CBC WITH DIFFERENTIAL/PLATELET
Basophil #: 0.3 10*3/uL — ABNORMAL HIGH (ref 0.0–0.1)
Basophil %: 3.8 %
Eosinophil %: 1.2 %
HCT: 32.1 % — ABNORMAL LOW (ref 40.0–52.0)
HGB: 10.9 g/dL — ABNORMAL LOW (ref 13.0–18.0)
Lymphocyte #: 1.8 10*3/uL (ref 1.0–3.6)
MCHC: 33.9 g/dL (ref 32.0–36.0)
Neutrophil #: 5.2 10*3/uL (ref 1.4–6.5)
Neutrophil %: 64.2 %
Platelet: 241 10*3/uL (ref 150–440)
RBC: 3.2 10*6/uL — ABNORMAL LOW (ref 4.40–5.90)
RDW: 14.9 % — ABNORMAL HIGH (ref 11.5–14.5)

## 2012-09-04 LAB — PROTIME-INR: INR: 2

## 2012-09-04 LAB — APTT: Activated PTT: 48.8 secs — ABNORMAL HIGH (ref 23.6–35.9)

## 2012-09-16 ENCOUNTER — Ambulatory Visit: Payer: Self-pay | Admitting: Internal Medicine

## 2012-09-17 ENCOUNTER — Inpatient Hospital Stay: Payer: Self-pay | Admitting: Surgery

## 2012-09-17 LAB — COMPREHENSIVE METABOLIC PANEL
Albumin: 2 g/dL — ABNORMAL LOW (ref 3.4–5.0)
Alkaline Phosphatase: 134 U/L (ref 50–136)
Anion Gap: 5 — ABNORMAL LOW (ref 7–16)
Bilirubin,Total: 2.6 mg/dL — ABNORMAL HIGH (ref 0.2–1.0)
Calcium, Total: 8.3 mg/dL — ABNORMAL LOW (ref 8.5–10.1)
Co2: 25 mmol/L (ref 21–32)
Creatinine: 1.58 mg/dL — ABNORMAL HIGH (ref 0.60–1.30)
EGFR (Non-African Amer.): 46 — ABNORMAL LOW
Osmolality: 250 (ref 275–301)
Potassium: 4.2 mmol/L (ref 3.5–5.1)
SGOT(AST): 93 U/L — ABNORMAL HIGH (ref 15–37)
Sodium: 122 mmol/L — ABNORMAL LOW (ref 136–145)

## 2012-09-17 LAB — CBC WITH DIFFERENTIAL/PLATELET
Basophil #: 0.1 10*3/uL (ref 0.0–0.1)
Eosinophil #: 0.1 10*3/uL (ref 0.0–0.7)
Eosinophil %: 1.1 %
HCT: 33.5 % — ABNORMAL LOW (ref 40.0–52.0)
HGB: 11.6 g/dL — ABNORMAL LOW (ref 13.0–18.0)
Lymphocyte %: 20.7 %
MCH: 33.7 pg (ref 26.0–34.0)
MCHC: 34.6 g/dL (ref 32.0–36.0)
Neutrophil #: 5.8 10*3/uL (ref 1.4–6.5)
RDW: 14.9 % — ABNORMAL HIGH (ref 11.5–14.5)

## 2012-09-17 LAB — APTT: Activated PTT: 53.3 secs — ABNORMAL HIGH (ref 23.6–35.9)

## 2012-09-17 LAB — PROTIME-INR: Prothrombin Time: 21.3 secs — ABNORMAL HIGH (ref 11.5–14.7)

## 2012-09-18 LAB — COMPREHENSIVE METABOLIC PANEL
Albumin: 1.9 g/dL — ABNORMAL LOW (ref 3.4–5.0)
Alkaline Phosphatase: 113 U/L (ref 50–136)
Anion Gap: 8 (ref 7–16)
Bilirubin,Total: 1.8 mg/dL — ABNORMAL HIGH (ref 0.2–1.0)
Chloride: 93 mmol/L — ABNORMAL LOW (ref 98–107)
EGFR (African American): 55 — ABNORMAL LOW
Glucose: 111 mg/dL — ABNORMAL HIGH (ref 65–99)
Potassium: 4.1 mmol/L (ref 3.5–5.1)
SGOT(AST): 81 U/L — ABNORMAL HIGH (ref 15–37)
Total Protein: 7.6 g/dL (ref 6.4–8.2)

## 2012-09-18 LAB — CBC WITH DIFFERENTIAL/PLATELET
HCT: 27.4 % — ABNORMAL LOW (ref 40.0–52.0)
HGB: 9.7 g/dL — ABNORMAL LOW (ref 13.0–18.0)
Lymphocyte #: 2.1 10*3/uL (ref 1.0–3.6)
Lymphocyte %: 24.2 %
MCH: 34.2 pg — ABNORMAL HIGH (ref 26.0–34.0)
MCHC: 35.2 g/dL (ref 32.0–36.0)
MCV: 97 fL (ref 80–100)
Monocyte #: 1.2 x10 3/mm — ABNORMAL HIGH (ref 0.2–1.0)
Neutrophil #: 5.2 10*3/uL (ref 1.4–6.5)
Platelet: 181 10*3/uL (ref 150–440)
RBC: 2.83 10*6/uL — ABNORMAL LOW (ref 4.40–5.90)
WBC: 8.6 10*3/uL (ref 3.8–10.6)

## 2012-09-18 LAB — BODY FLUID CELL COUNT WITH DIFFERENTIAL
Lymphocytes: 41 %
Nucleated Cell Count: 130 /mm3
Other Mononuclear Cells: 53 %

## 2012-09-18 LAB — PROTIME-INR: Prothrombin Time: 18.6 secs — ABNORMAL HIGH (ref 11.5–14.7)

## 2012-10-16 ENCOUNTER — Ambulatory Visit: Payer: Self-pay | Admitting: Internal Medicine

## 2012-11-16 DEATH — deceased

## 2014-01-13 IMAGING — US US GUIDE NEEDLE - US PARA
1 series · 7 of 7 positions shown · non-contrast
Comparison: none

REASON FOR EXAM: ascites
COMMENTS:

[Series 1: us guide needle - us para · 0.31mm/px · 7 of 7 slices shown]
[im 1/7]
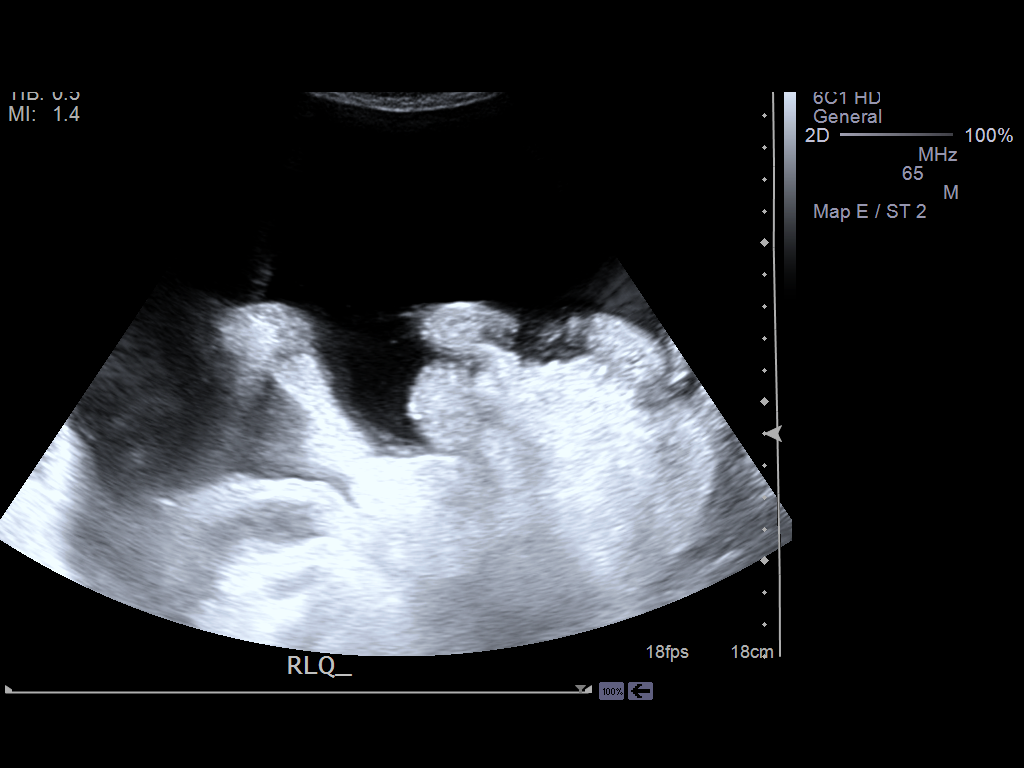
[im 2/7]
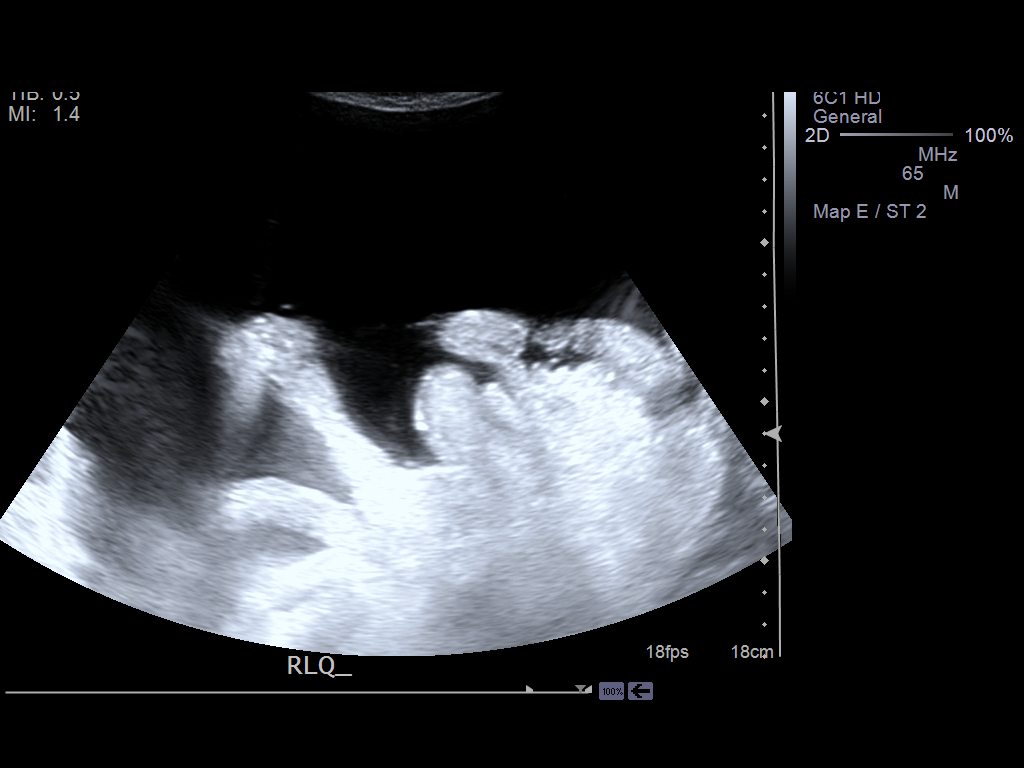
[im 3/7]
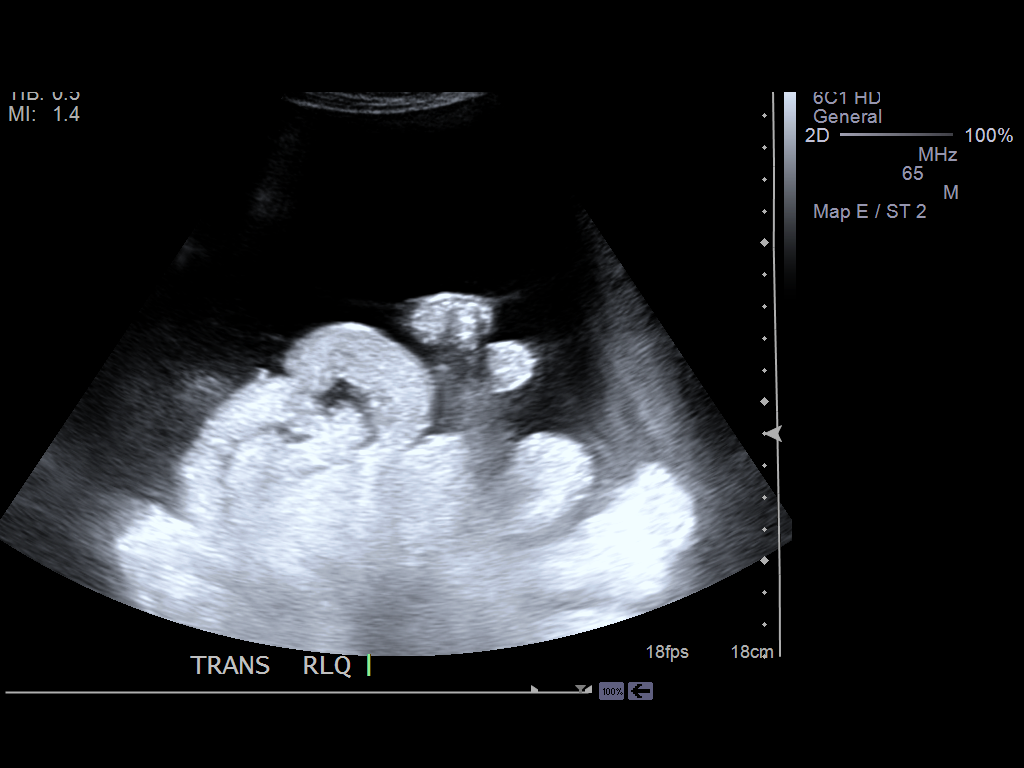
[im 4/7]
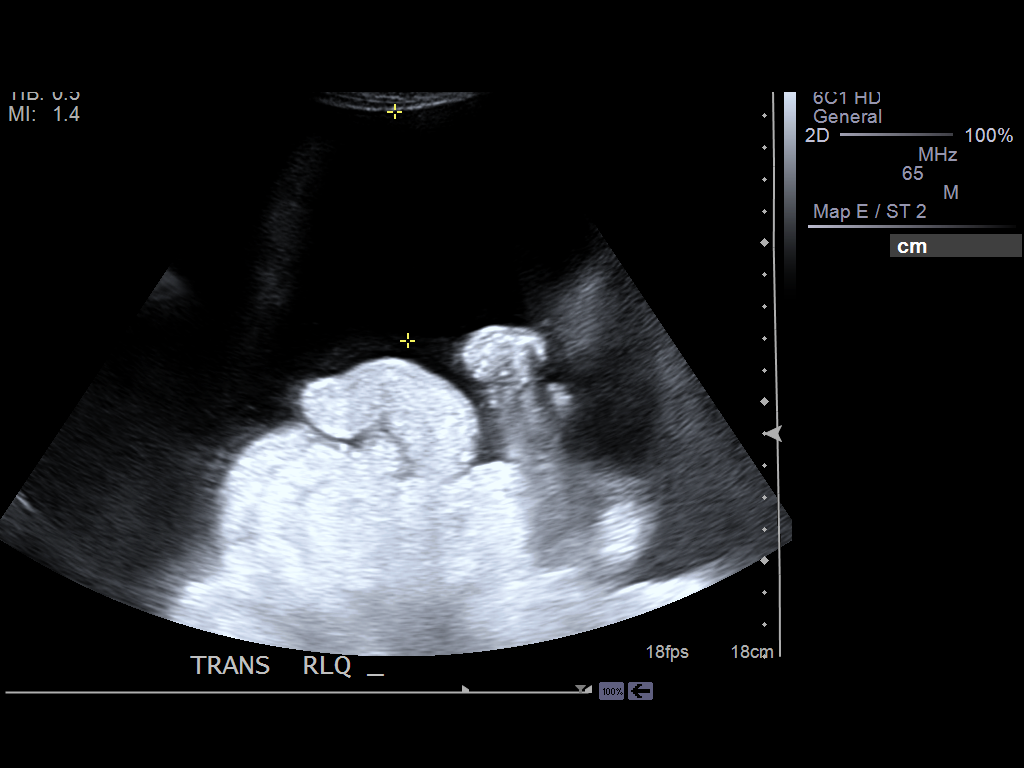
[im 5/7]
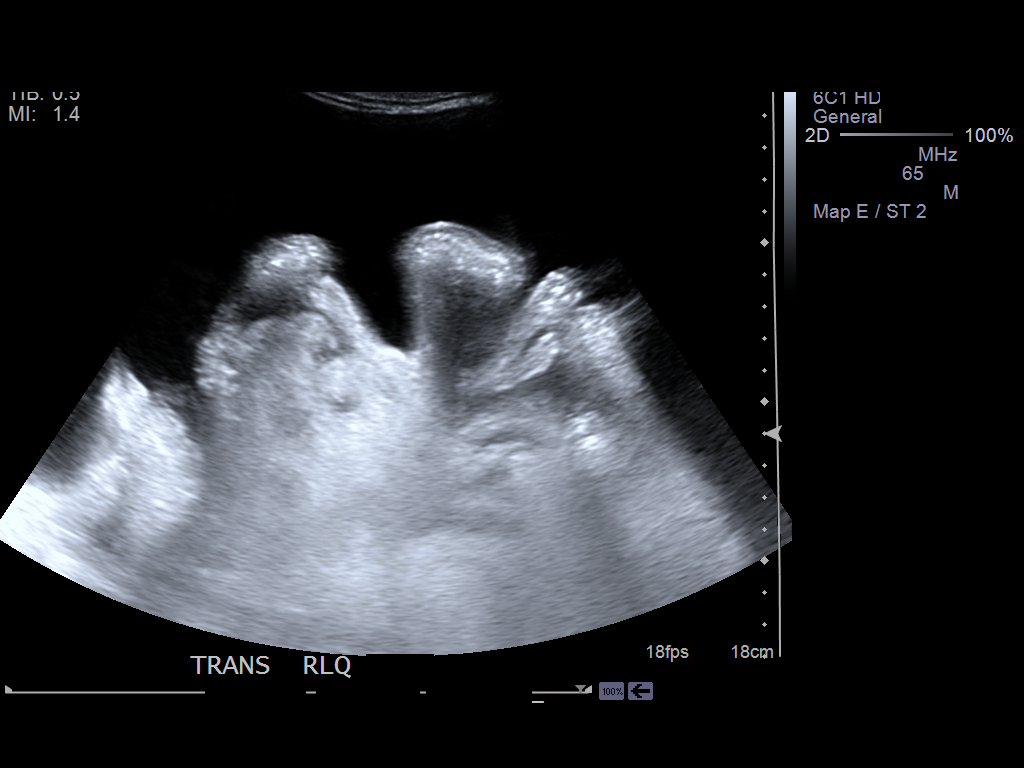
[im 6/7]
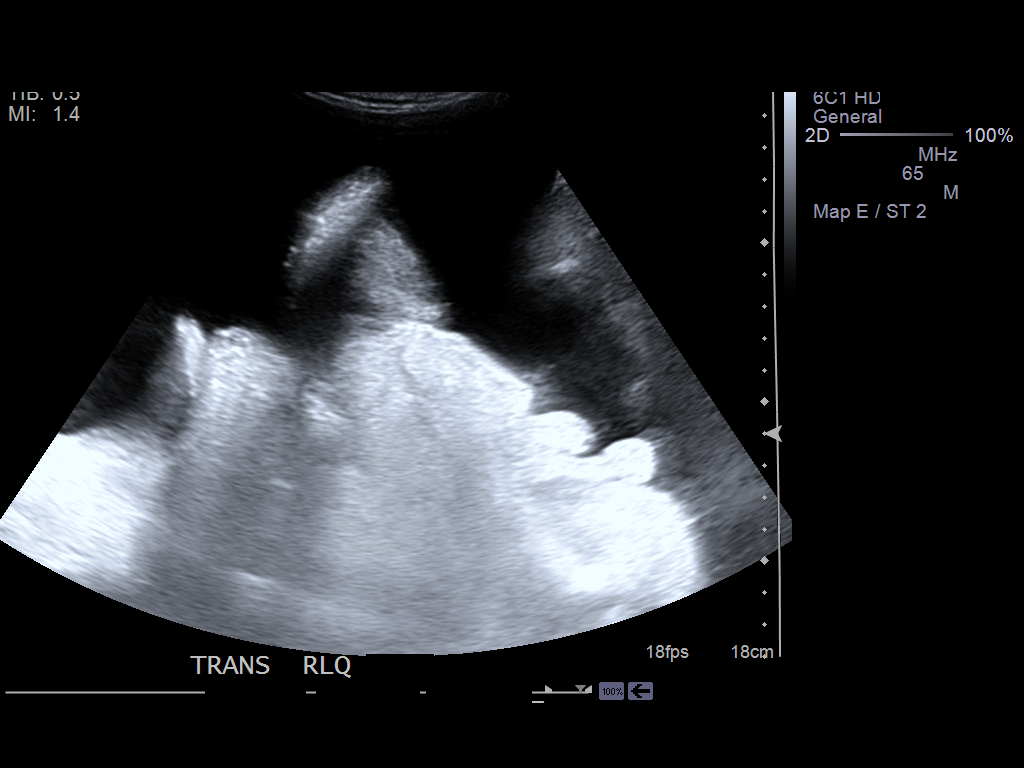
[im 7/7]
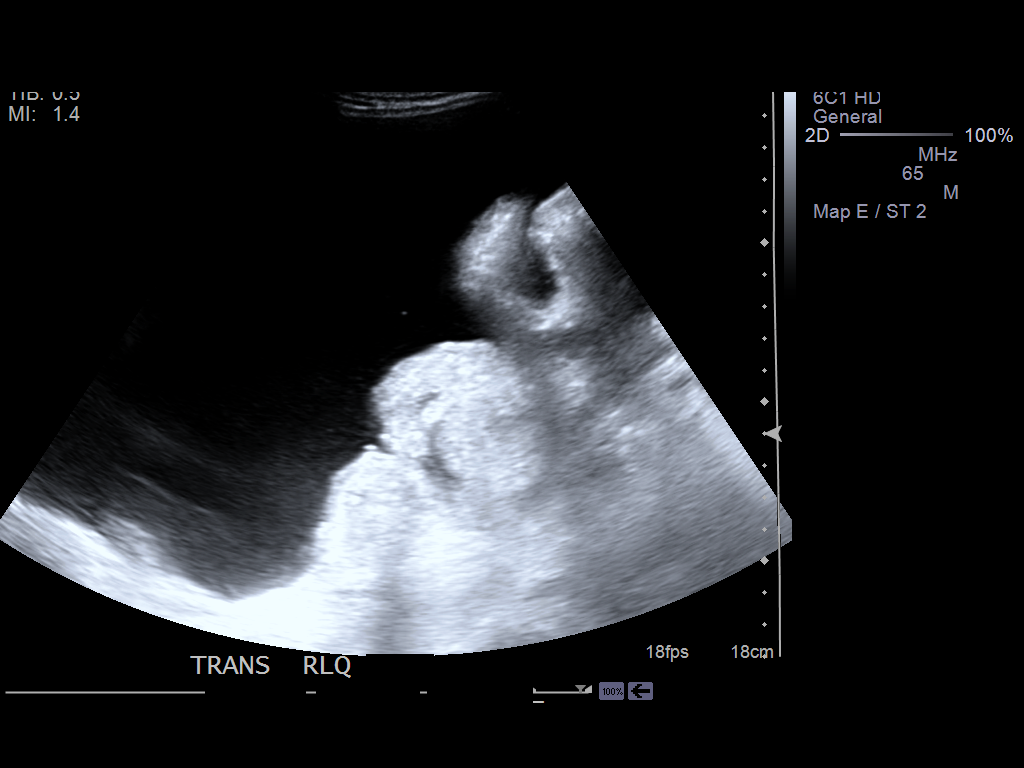

[7 of 7 positions shown; findings below may reference images not displayed]

PROCEDURE:     US  - US GUIDED PARACENTESIS  - August 10, 2012  [DATE]

RESULT:

Procedure: The patient's surrogate was informed of the risks and benefits of
the procedure and proper informed consent was obtained. The patient was
brought to the Ultrasound Suite and placed in a supine position. The right
lower quadrant was evaluated. A proper entry site for ultrasound-guided
paracentesis was established. The overlying soft tissues were then prepped
and draped in the usual sterile fashion. Utilizing 8 mL of 1% lidocaine
without epinephrine the overlying soft tissues were anesthetized. A small
dermatotomy was performed at the entry site. The peritoneal cavity was
cannulated with Kiri Jim paracentesis catheter; 2,000 mL of yellow colored
fluid was removed from the peritoneal cavity. This is the patient's initial
paracentesis and thus is only 2,000 mL. If clinically warranted, repeat
paracentesis can be obtained at a later time for further fluid removal.
IMPRESSION: 1. Ultrasound-guided paracentesis as described above. The patient tolerated
the procedure without complications.

## 2014-01-19 IMAGING — US US GUIDE NEEDLE - US PARA
1 series · 10 of 10 positions shown · non-contrast
Comparison: none

REASON FOR EXAM: ascites
COMMENTS:

PROCEDURE:     US  - US GUIDED PARACENTESIS  - August 16, 2012  [DATE]
RESULT:     History: Ascites.

[Series 1: us guide needle - us para · 0.31mm/px · 10 of 10 slices shown]
[im 1/10]
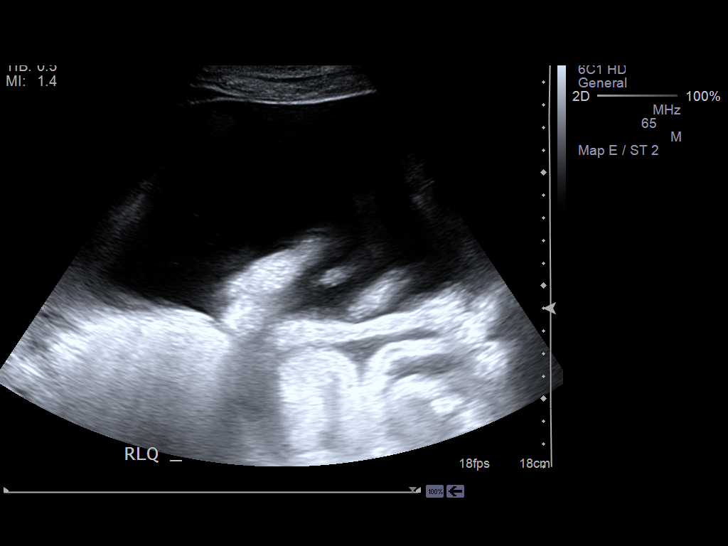
[im 2/10]
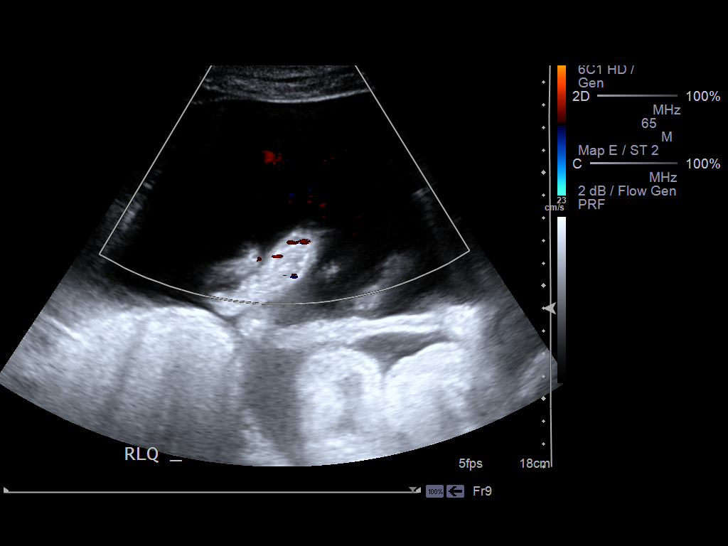
[im 3/10]
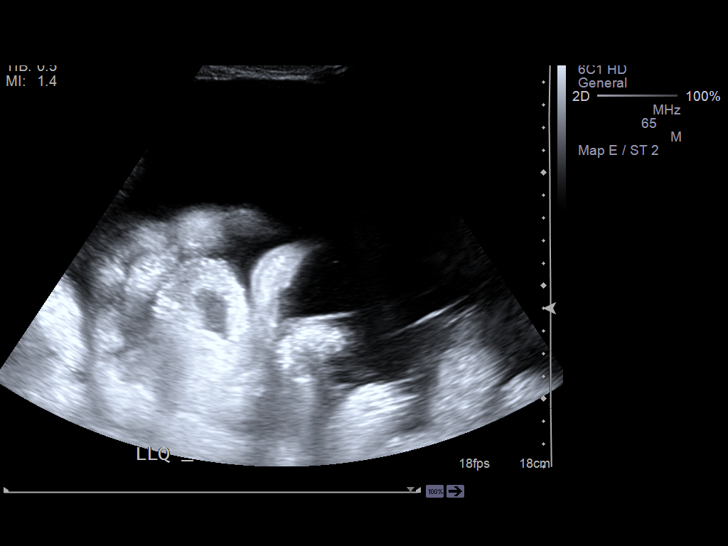
[im 4/10]
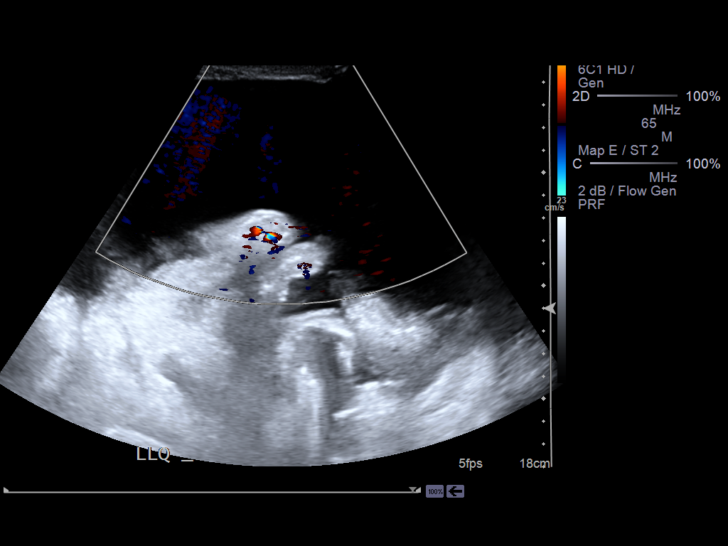
[im 5/10]
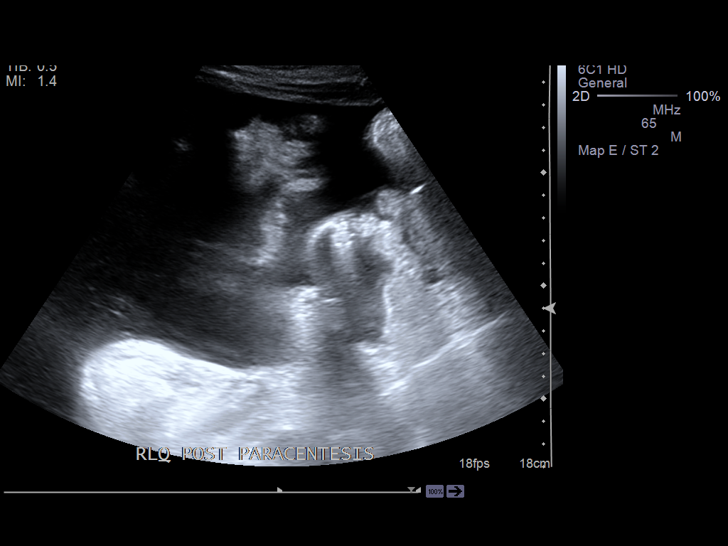
[im 6/10]
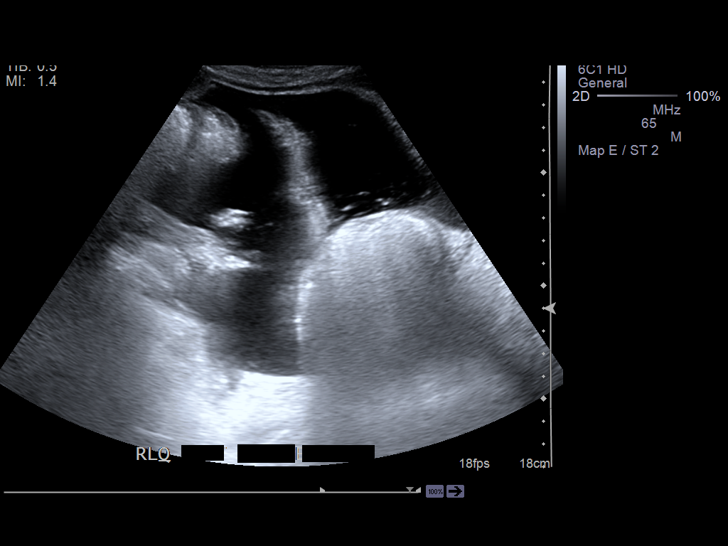
[im 7/10]
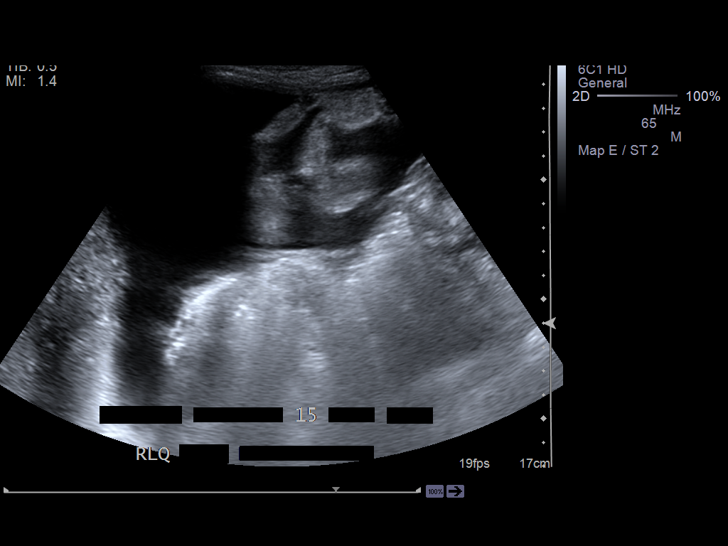
[im 8/10]
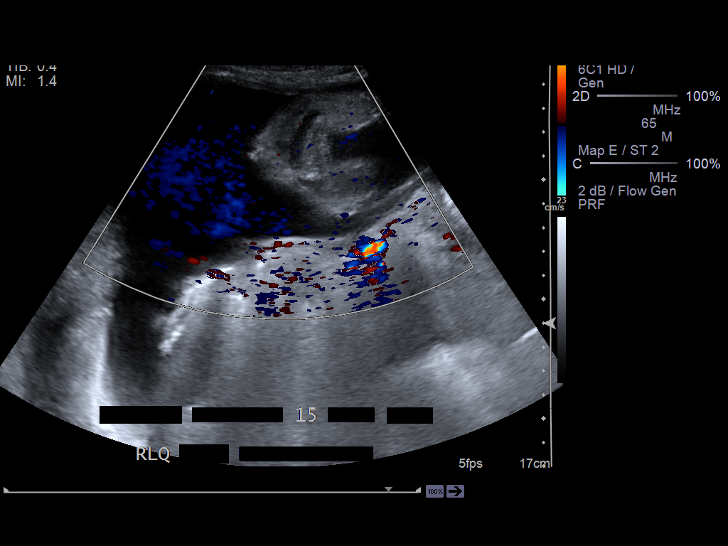
[im 9/10]
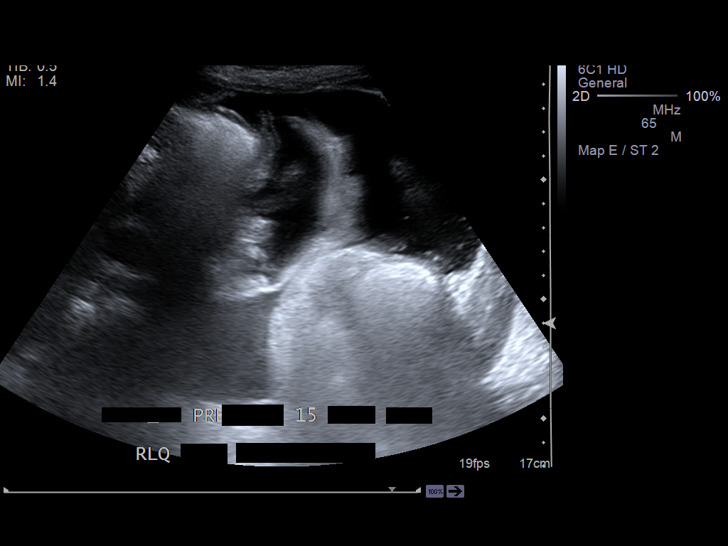
[im 10/10]
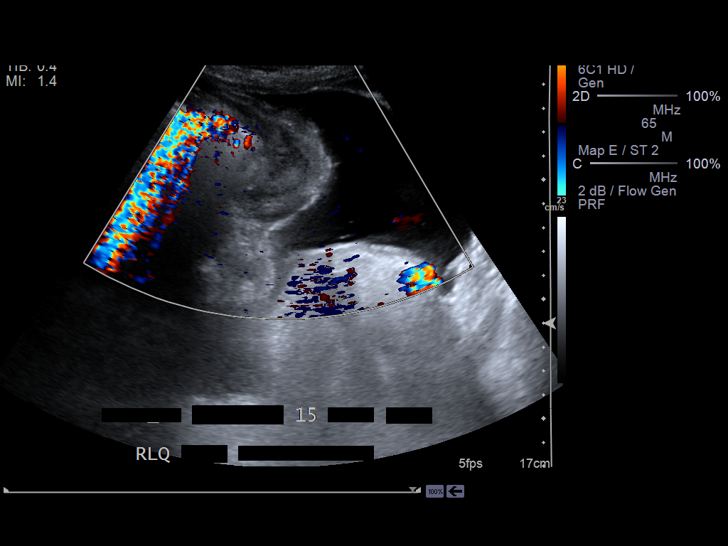

[10 of 10 positions shown; findings below may reference images not displayed]

FINDINGS: After discussing the risk and benefits of this procedure the
patient was obtained ,the abdomen sterilely prepped and draped . Following
administration 1% lidocaine Dre needle advanced into the ascites and
7044 cc of fluid removed. No complications.
IMPRESSION: Successful ultrasound directed paracentesis. Hemostasis
achieved following the procedure.

## 2014-08-05 NOTE — H&P (Signed)
PATIENT NAME:  Jake Cobb, Jake MR#:  [id number] DATE OF BIRTH:  11/26/1950  DATE OF ADMISSION:  02/21/2012  PRIMARY CARE PHYSICIAN: Donell SievertAaron Miller, [facility]   GASTROENTEROLOGIST: Christena DeemMartin U. Skulskie, MD  CHIEF COMPLAINT: Left lateral back pain for the last three days, feeling very weak.  HISTORY OF PRESENT ILLNESS: Jake Cobb is a pleasant 64 year old African American gentleman who has history of cirrhosis of liver, alcohol-induced, history of ascites, hepatitis C, chronic tobacco abuse with history of chronic obstructive pulmonary disease and coronary artery disease, comes to the Emergency Room with increasing weakness, not able to get around at home well, and he started noticing a lot of pain on his left lateral back with a significant amount of swelling. The patient denies any fall other than a fall way back in May without any significant injury.   In the Emergency Room, it was found the patient has a large left lateral swelling in the back which is tender, and hence a CT of the chest was done which showed a large collection of fluid, most likely a hematoma. The patient has acquired coagulopathy from his cirrhosis and was on Plavix as well. He was also noted to have a white count of 18,000, and hence Internal Medicine was consulted for admission and further evaluation.   PAST MEDICAL HISTORY: As listed: 1. Hypercholesterolemia.  2. Hypertension.  3. Depression.  4. Anxiety.  5. Posttraumatic stress disorder.  6. Hepatitis C.  7. Rheumatoid arthritis.  8. Carotid stent placement.   ALLERGIES: Aspirin, doxycycline, and sulfa.    MEDICATIONS:  1. Spironolactone 50 mg, 1 tablet p.o. daily.  2. Pravastatin 20 mg p.o. daily.  3. Omeprazole 20 mg p.o. daily.  4. Nitrostat 0.2 mg, 1 tablet sublingual every 5 minutes as needed.  5. Metoprolol tartrate 25 mg b.i.d.  6. Hydrochlorothiazide 12.5 mg p.o. daily.  7. Plavix 75 mg p.o. daily.   SOCIAL HISTORY: The patient drank for  many years. He quit in August of 2013. No history of smoking, quit about 7 years ago. He denies any other drug use. He has 3 healthy children. He is a widower.   FAMILY HISTORY: Father died of coronary artery disease and myocardial infarction at age 65. Mother died of chronic kidney disease and Alzheimer's when she was 7579.    REVIEW OF SYSTEMS: CONSTITUTIONAL: Positive for fatigue, weakness. No fever. EYES: No blurred or double vision or glaucoma. ENT: No tinnitus, ear pain, hearing loss. RESPIRATORY: No cough, wheeze, or hemoptysis. Positive for chronic shortness of breath due to asthma. CARDIOVASCULAR: No chest pain, orthopnea, or edema. GASTROINTESTINAL: No nausea, vomiting, diarrhea, or abdominal pain. The patient does have significant ascites and has "dragging sensation." GU: No dysuria or hematuria or frequency. ENDOCRINE: No polyuria or nocturia, thyroid problems. HEMATOLOGY: Positive for chronic anemia. SKIN: No acne or rash. MUSCULOSKELETAL: Positive for arthritis and back pain. NEUROLOGIC: No cerebrovascular accident, transient ischemic attack. PSYCHIATRIC: No anxiety or depression.  All other systems reviewed are negative.   PHYSICAL EXAMINATION:  GENERAL: The patient is awake, alert, oriented x3, not in acute distress.   VITAL SIGNS: He is afebrile. Pulse is 92 and regular, blood pressure is 95/63, saturations are 97% on 2 liters.   GENERAL: The patient is well dressed. He is mild-to-moderately poorly nourished. He is cachectic and thin, generalized pallor present.   HEENT: Atraumatic, normocephalic. Pupils are equal, round, and reactive to light and accommodation. Extraocular movements are intact. Oral mucosa is dry.  NECK: Supple. No JVD. No carotid bruit.   RESPIRATORY: Decreased breath sounds in both the bases, more so on the left than the right. No respiratory distress or labored breathing.   CARDIOVASCULAR: Both the heart sounds are normal. Rate, rhythm regular. PMI is not  lateralized. Chest is nontender.   EXTREMITIES: Good pedal pulses, good femoral pulses. No lower extremity edema.    ABDOMEN: Soft, benign, and nontender. No organomegaly. Positive bowel sounds.   NEUROLOGIC: Grossly intact cranial nerves II through XII. No motor or sensory deficits.   PSYCHIATRIC: The patient is awake, alert, oriented x3.   ABDOMEN: Abdomen is distended. There is significant ascites with fluid thrill present. There is no tenderness on the abdomen. No organomegaly is felt. I cannot feel obvious bowel sounds either.   LABORATORY, DIAGNOSTIC AND RADIOLOGICAL DATA: CT of the chest with contrast showed a moderate-sized left-sided pleural effusion with right small right pleural effusion. These are new since the earlier study. In addition, there is a large amount of ascites that suggests marked interval decrease in the volume of the hepatic parenchyma since May 2012 suggesting cirrhosis. There is no evidence of congestive heart failure. In the chest wall, soft tissue and muscles deep to the subcutaneous fat are expanded in the fashion that suggests an infiltrative-type process. This could be subclinical hemorrhage. A solid mass such as a sarcoma is not felt given the large area of involvement extending from approximately the left thoracic inlet inferiorly to the left upper lateral abdominal wall.  Ammonia is 29. Troponin is 0.07. Cardiac enzymes are negative. PT-INR is 28 and 2.6. White count is 18.3, hemoglobin and hematocrit  is 10.8 and 31.4, platelet count is 257. Glucose is 124, BUN 8, creatinine is 0.9, sodium 129, potassium 4.2, chloride is 97, bicarbonate 21, calcium 8.5, bilirubin is 4.2, alkaline phosphatase is 104, albumin is 1.7.   ASSESSMENT AND PLAN: Jake Cobb is a 64 year old with history of coronary artery disease, hypertension, hyperlipidemia, comes to the Emergency Room with:   1. Left lateral back pain with significant swelling suspicious for a large hematoma likely  due to acquired coagulopathy and the patient being on Plavix: The patient denies any fall other than one back in May.  Tender to touch and warm to feel. There is no fever but elevated white count. We will start the patient on IV. We will admit the patient on the General Surgical floor. We will start IV antibiotics empirically for now, follow blood cultures and have surgery see the patient. I am not sure if the patient is going to require evacuation of this hematoma.  2. 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  We will hold off on Plavix. Hemoglobin and hematocrit is stable, no urgent indication for fresh frozen plasma.  3. Ascites due to cirrhosis of liver: Continue spironolactone and hydrochlorothiazide. No signs or symptoms suggestive of peritonitis.  4. Chronic persistent left pleural effusion and small right pleural effusion due to cirrhosis of liver: The patient has history of tobacco abuse in the past. He likely has underlying chronic obstructive pulmonary disease. We will need to assess his need for oxygen given his chronic obstructive pulmonary disease, poor lung volumes given bilateral pleural effusions. We will need to do that prior to discharge.  5. Coronary artery disease, status post stent in the past: He denies any chest pain. We will hold off on Plavix at this time.  Further work-up according to the patient's clinical course. Hospital admission plan was discussed with the patient who is agreeable to it. There is no family member in the Emergency Room.   CODE STATUS:  The patient is a FULL CODE.     TIME SPENT: 50 minutes.  ____________________________ Wylie Hail Allena Katz, MD sap:cbb D: 02/21/2012 15:31:29 ET T: 02/21/2012 16:16:42 ET JOB#: [id number]  cc: Dava Rensch A. Allena Katz, MD, <Dictator> Christena Deem, MD Donell Sievert, MD at [facility]  Willow Ora MD ELECTRONICALLY SIGNED  02/21/2012 17:40

## 2014-08-05 NOTE — Consult Note (Signed)
Chief Complaint:   Subjective/Chief Complaint denies n/v or abdominal pain. some increase of abdominal girth with increased ascites.   VITAL SIGNS/ANCILLARY NOTES: **Vital Signs.:   11-Nov-13 10:03   Vital Signs Type Routine   Temperature Temperature (F) 98.1   Celsius 36.7   Temperature Source oral   Pulse Pulse 116   Respirations Respirations 20   Systolic BP Systolic BP 211   Diastolic BP (mmHg) Diastolic BP (mmHg) 75   Mean BP 89   Pulse Ox % Pulse Ox % 96   Oxygen Delivery 2L; Nasal Cannula  *Intake and Output.:   11-Nov-13 07:01   Stool  small stool   Brief Assessment:   Cardiac Regular    Respiratory clear BS    Gastrointestinal details normal Bowel sounds normal  No rebound tenderness  No gaurding  mild tenderness left abdomen, otherwise nontender.  positive ascites.   Lab Results: Routine Hem:  05-Nov-13 11:17    Hemoglobin (CBC) -    12:11    Hemoglobin (CBC)  10.8  06-Nov-13 04:34    Hemoglobin (CBC)  8.3    15:12    Hemoglobin (CBC)  8.0   Hemoglobin (CBC) -  08-Nov-13 04:50    Hemoglobin (CBC)  5.6  09-Nov-13 00:00    Hemoglobin (CBC)  7.7  11-Nov-13 04:11    Hemoglobin (CBC)  8.0 (Result(s) reported on 27 Feb 2012 at 05:15AM.)   Assessment/Plan:  Assessment/Plan:   Assessment 1) cirrhosis, history of etoh abuse, none since 8/13.  coagulopathy complicated by consumption of factors from bleeding after trauma. stable over weekend,hgb increasing since tfx.    Plan continue current, recheck cbc and pt in am.  awaiting cbc and met c ordered this am. will increase aldactone to 50 mg daily.   Electronic Signatures: Loistine Simas (MD)  (Signed 11-Nov-13 13:39)  Authored: Chief Complaint, VITAL SIGNS/ANCILLARY NOTES, Brief Assessment, Lab Results, Assessment/Plan   Last Updated: 11-Nov-13 13:39 by Loistine Simas (MD)

## 2014-08-05 NOTE — Consult Note (Signed)
Chief Complaint:   Subjective/Chief Complaint chart briefly reviewed this am, marked drop of hgb noted in the setting of coagulopathy.  Plt good.  concern for non gi bleeding as there is no evidence of gi bleeding.  recommend ct chest and abdomen, tfx as needed, ffp.  Discussed with Dr Allena KatzPatel.   Electronic Signatures: Barnetta ChapelSkulskie, Ellamarie Naeve (MD)  (Signed [phone]-Nov-13 09:00)  Authored: Chief Complaint   Last Updated: [id number]-Nov-13 09:00 by Barnetta ChapelSkulskie, Kaylani Fromme (MD)

## 2014-08-05 NOTE — Consult Note (Signed)
Chief Complaint:   Subjective/Chief Complaint events noted. CT indicating bleeding into abdomen as well as chest/chest wall accounting for acute drop of hemoglobin.  currently undergoing tfx.  patient denies abdominal pain or nausea.   VITAL SIGNS/ANCILLARY NOTES: **Vital Signs.:   08-Nov-13 15:50   Vital Signs Type Pre-Blood   Temperature Temperature (F) 97.8   Celsius 36.5   Temperature Source oral   Pulse Pulse 113   Pulse source if not from Vital Sign Device per cardiac monitor   Respirations Respirations 18   Systolic BP Systolic BP 867   Diastolic BP (mmHg) Diastolic BP (mmHg) 70   Mean BP 89   BP Source  if not from Vital Sign Device non-invasive   Pulse Ox % Pulse Ox % 95   Oxygen Delivery 2L   Brief Assessment:   Cardiac Regular    Respiratory clear right chest, poor breath sounds, dull percussion left chest.    Gastrointestinal details normal Bowel sounds normal  No rebound tenderness  No rigidity  mild distension, mild discomfort to palpation left abdomen.   Lab Results: Routine BB:  08-Nov-13 09:09    Fresh Frozen Plasma Unit 1 Ready (Result(s) reported on 24 Feb 2012 at 10:13AM.)   Crossmatch Unit 1 Issued   Crossmatch Unit 2 Issued (Result(s) reported on 24 Feb 2012 at 03:57PM.)   ABO Group + Rh Type O Positive   Antibody Screen NEGATIVE (Result(s) reported on 24 Feb 2012 at 10:11AM.)  Routine Chem:  08-Nov-13 04:50    Ammonia, Plasma < 25 (Result(s) reported on 24 Feb 2012 at 05:54AM.)   Glucose, Serum  115   BUN 13   Creatinine (comp) 0.86   Sodium, Serum  130   Potassium, Serum 4.1   Chloride, Serum 99   CO2, Serum  20   Calcium (Total), Serum  7.6   Anion Gap 11   Osmolality (calc) 262   eGFR (African American) >60   eGFR (Non-African American) >60 (eGFR values <55m/min/1.73 m2 may be an indication of chronic kidney disease (CKD). Calculated eGFR is useful in patients with stable renal function. The eGFR calculation will not be reliable in  acutely ill patients when serum creatinine is changing rapidly. It is not useful in  patients on dialysis. The eGFR calculation may not be applicable to patients at the low and high extremes of body sizes, pregnant women, and vegetarians.)   Result Comment DIFF - SMEAR SCANNED  Result(s) reported on 24 Feb 2012 at 07:28AM.  Routine Coag:  08-Nov-13 04:50    Prothrombin  28.6   INR 2.7 (INR reference interval applies to patients on anticoagulant therapy. A single INR therapeutic range for coumarins is not optimal for all indications; however, the suggested range for most indications is 2.0 - 3.0. Exceptions to the INR Reference Range may include: Prosthetic heart valves, acute myocardial infarction, prevention of myocardial infarction, and combinations of aspirin and anticoagulant. The need for a higher or lower target INR must be assessed individually. Reference: The Pharmacology and Management of the Vitamin K  antagonists: the seventh ACCP Conference on Antithrombotic and Thrombolytic Therapy. CYPPJK.9326Sept:126 (3suppl): [id number] A HCT value >55% may artifactually increase the PT.  In one study,  the increase was an average of 25%. Reference:  "Effect on Routine and Special Coagulation Testing Values of Citrate Anticoagulant Adjustment in Patients with High HCT Values." American Journal of Clinical Pathology 2006;126:400-405.)  Routine Hem:  08-Nov-13 04:50    WBC (CBC)  22.6  RBC (CBC)  1.60   Hemoglobin (CBC)  5.6   Hematocrit (CBC)  17.2   Platelet Count (CBC) 231   MCV  108   MCH  35.3   MCHC 32.8   RDW 13.9   Neutrophil % 68.5   Lymphocyte % 19.9   Monocyte % 11.1   Eosinophil % 0.2   Basophil % 0.3   Neutrophil #  15.5   Lymphocyte #  4.5   Monocyte #  2.5   Eosinophil # 0.0   Basophil # 0.1   Assessment/Plan:  Assessment/Plan:   Assessment 1) cirrhosis/coagulopathy not responsive to vit K. Increasing ascites.  2) post traumatic bleeding into the left  chest wall , tracking into the abdomen and pelvis.  Poor candidate for surgery.  currently being transfused prbc and ffp.  Patietn with coagulopathy as above, and on plavix before admission due to h/o CAD.    Plan 1) continue with transfusion as you are, recommend serial hgb, consider further ffp.  Ascites will increase with amounts fo blood products given.  Poor prognosis, palliative care consult noted and appreciated.  I will not be available until monday, but will ask Dr Dionne Milo to  come by.   Electronic Signatures: Loistine Simas (MD)  (Signed [phone] 17:05)  Authored: Chief Complaint, VITAL SIGNS/ANCILLARY NOTES, Brief Assessment, Lab Results, Assessment/Plan   Last Updated: 08-Nov-13 17:05 by Loistine Simas (MD)

## 2014-08-05 NOTE — Consult Note (Signed)
Chief Complaint:   Subjective/Chief Complaint No new complaints. One BM last night. No blood tests today.   VITAL SIGNS/ANCILLARY NOTES: **Vital Signs.:   10-Nov-13 05:55   Vital Signs Type Routine   Temperature Temperature (F) 98.4   Celsius 36.8   Temperature Source Oral   Pulse Pulse 117   Respirations Respirations 16   Systolic BP Systolic BP 108   Diastolic BP (mmHg) Diastolic BP (mmHg) 58   Mean BP 74   Pulse Ox % Pulse Ox % 91   Pulse Ox Activity Level  At rest   Oxygen Delivery 2L   Brief Assessment:   Additional Physical Exam Overall same. Awake and alert. Abdomen is mildly distended without significant ascites.   Assessment/Plan:  Assessment/Plan:   Assessment Cirrhosis of liver. Left sided chest wall hematoma with extension into the abdomen. Coagulopathy. Bloos loss abnemia. Ascites.    Plan Repeat CBC and PT/INR in am. As patient seems to be hemodynamically more stable, we can gradually resume diuretics and betablockers as tolerated. Dr. Ricki RodriguezSkulski will resume care in am.   Electronic Signatures: Lurline DelIftikhar, Jendayi Berling (MD)  (Signed [phone]-Nov-13 10:15)  Authored: Chief Complaint, VITAL SIGNS/ANCILLARY NOTES, Brief Assessment, Assessment/Plan   Last Updated: 10-Nov-13 10:15 by Lurline DelIftikhar, Kashius Dominic (MD)

## 2014-08-05 NOTE — Discharge Summary (Signed)
PATIENT NAME:  Jake Cobb, Jake Cobb MR#:  [id number] DATE OF BIRTH:  1951/03/23  DATE OF ADMISSION:  02/21/2012 DATE OF DISCHARGE:  02/28/2012  DISCHARGE DIAGNOSES:  1. Left lateral thoracic and abdominal wall hematoma. 2. Acute blood loss anemia.  3. 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  4. Coronary artery disease.  5. Left lower lobe pneumonia.  6. Ascites.  7. Chronic bilateral pleural effusions.  8. Failure to thrive and malnutrition.   CODE STATUS: DO NOT RESUSCITATE/ DO NOT INTUBATE.   CONSULTATIONS:  1. Dr. Harvie JuniorPhifer, palliative care. 2. Dr. Anda KraftMarterre, surgery. 3. Dr. Marva PandaSkulskie, GI.   ADMITTING HISTORY AND PHYSICAL: Please see detailed history and physical dictated previously on 02/21/2012. In brief, a 64 year old African American male patient with history of cirrhosis of liver related to alcohol, ascites, hepatitis C, and chronic tobacco abuse who presented to the Emergency Room with increasing weakness and pain in his left lateral back with swelling. The patient had a CT scan which showed possible large hematoma and with acquired coagulopathy of INR of 3, the patient was admitted to the hospitalist service. He did have leukocytosis.   HOSPITAL COURSE:  1. Left lateral thoracic and abdominal wall hematoma. The patient with acquired coagulopathy and fall had a large hematoma, with which his hemoglobin dropped. He had 1 unit of blood transfusion during the hospital stay. His hemoglobin has been stable over the last three days at 8 and the patient is being discharged home. Surgery was consulted for possible intervention and suggested only conservative management. The patient with his elevated INR and poor prognosis was not thought to be a surgical candidate and no procedures were done. Hemoglobin is stable. Hematoma seems to have stabilized, but the patient is a high risk for further hematomas and bleeding with his elevated INR secondary to the liver disease. This was  discussed with the patient and the patient is being discharged home with Hospice.  2. Left lower lobe pneumonia. The patient had elevated white cells, some shortness of breath, acute respiratory failure needing oxygen during the hospital stay. X-ray showed  pneumonia. The patient has been on Levaquin. His white count is trending down. He is afebrile and is saturating well on room air at the time of discharge.  3. Cirrhosis and advanced liver disease. The patient was seen by Dr. Harvie JuniorPhifer who recommended the patient be discharged home with Hospice. The patient is in agreement. He is DNR/DNI and is being discharged home. The patient was also seen by Dr. Marva PandaSkulskie of GI who suggested no immediate intervention and only medical management.   On the day of discharge, the patient has a temperature of 98.3, pulse 88, respirations 18, blood pressure 101/64, saturating 95% on room air, and abdominal exam showing ascites and also skin showing large hematoma in the left lateral thoracic and abdominal wall which seems to be stable. The patient is being discharged home with guarded prognosis with his advanced multiple comorbidities.   DISCHARGE MEDICATIONS:  1. Spironolactone 15 mg oral once a day.  2. Nitrostat 0.3 mg sublingual every five minutes as needed for chest pain.  3. Omeprazole 20 mg oral once a day.  4. Toprol-XL 25 mg oral once a day.  5. Zofran 4 mg oral every six hours as needed for nausea and vomiting.  6. Zolpidem 5 mg oral once a day as needed at bedtime for insomnia.  7. Ensure 240 mL oral 3 times a day.  8. Levaquin 500 mg oral once a day  for four days.  9. MiraLAX as needed for constipation once a day.  10. Combivent 1 puff inhaled 4 times a day.  11. Oxycodone 5 mg oral 4 times a day as needed for pain.  12. Lasix 20 mg oral every other day.   DISCHARGE INSTRUCTIONS:  1. Further followup with the Hospice team.  2. The patient will be on a regular diet.  3. Activity as tolerated.  4. The  patient is a high risk for further complications from his elevated INR and further bleeding and hematoma and complications from his liver, and he is aware of this.  5. Discharge home with Hospice.   TIME SPENT: Time spent today on this discharge dictation along with coordinating care and counseling of the patient was 45 minutes.    ____________________________ Molinda Bailiff Kailly Richoux, MD srs:bjt D: 02/28/2012 13:11:24 ET T: 02/29/2012 10:11:26 ET JOB#: [id number]  cc: Wardell Heath R. Montrice Gracey, MD, <Dictator> Orie Fisherman MD ELECTRONICALLY SIGNED 03/03/2012 9:00

## 2014-08-05 NOTE — Consult Note (Signed)
PATIENT NAME:  Jake Cobb, Jake Cobb MR#:  [id number] DATE OF BIRTH:  27-Mar-1951  DATE OF CONSULTATION:  02/23/2012  REFERRING PHYSICIAN:  Dr. Enedina Finner  CONSULTING PHYSICIAN:  Christena Deem, MD  REASON FOR CONSULTATION: Ascites due to cirrhosis of the liver.   HISTORY OF PRESENT ILLNESS: [name] is a pleasant 64 year old African American male with whom I am familiar. I have seen him in the hospital before and also follow up as an outpatient. He has a history of alcohol and hepatitis C related cirrhosis. His last alcohol was in August 2013. He was admitted to the hospital at that time with shortness of breath. I had seen him twice in follow up in the outpatient setting. I had started him on some Aldactone and Lasix which gave him a very good diuresis over the course of about a month. He had dropped about 40 pounds of fluid weight and he had no appreciable ascites. Then he developed some itching apparently in relation to sulfa allergy/Lasix use and his Lasix had to be stopped. He then developed some repeat problems with ascites. After seeing him again in the clinic I had increased his Aldactone a bit and started hydrochlorothiazide. He had not been seen in follow up in that regard having been admitted to the hospital on 02/21/2012. At that time he was feeling very weak and he had fallen and had complaint of some left lateral back pain for several days. When I talked with him this morning he stated that he had fallen the morning of admission or perhaps a day before. He had been having pain under his left collar bone and then after the fall had some marked pain on the left posterior/lateral chest region. He was found to have a large hematoma in that area. He has been on Plavix due to history of carotid stent. When he came to the hospital he was noted to have an increased amount of coagulopathy. His pro time as an outpatient was 18.7, INR 1.7. Over the course of his hospitalization he has had some increased  complaint of abdominal fluid. He states he has had problems with his appetite for about two months. He has been evasive in discussing this, however, his daughter who was in the room also agreed that he has been losing weight with decreased appetite. He further relates an episode of some nausea where he threw up what looked like Coca-Cola, however, he stated he had just drink some grape juice. This occurred two days prior to admission and has not been repeated. He has been having some lower abdominal discomfort. He has a bowel movement about every 2 to 3 days. When I last saw him in the office I placed him on some MiraLax and Analpram cream for external hemorrhoids and he stated that the MiraLax was very helpful for him.      PAST MEDICAL HISTORY:  1. Coronary artery disease, cardiac catheterization with a drug-eluting stent placed in April 2012 hence the reason for his Plavix. He had an admission at [facility] 09/08/2010 for chest pain ruled out for myocardial infarction. The stent was patent on repeat catheterization.  2. History of chronic hepatitis C.  3. Hypertension.  4. Alcohol abuse. 5. Hypercholesterolemia.  6. Rheumatoid arthritis. 7. Anxiety, depression. 8. Posttraumatic stress disorder.   ALLERGIES: He is allergic to sulfa, aspirin and doxycycline.   OUTPATIENT MEDICATIONS:  1. Aldactone 50 mg 1 tablet daily. 2. Pravastatin 40 mg. 3. Omeprazole 20 mg. 4. Nitrostat 0.2 mg 1  q.5 minutes p.r.n.  5. Metoprolol 25 mg b.i.d.  6. Hydrochlorothiazide 12.5 mg daily. 7. Plavix 75 mg a day.   SOCIAL HISTORY: He is a nonsmoker, previous smoker. He quit about seven years ago. He is on Social Security disability. Heavy alcohol use for a number of years, none since this past August.   GI FAMILY HISTORY: Negative for colorectal cancer, liver disease, or ulcers.   REVIEW OF SYSTEMS: Per admission history and physical.   PHYSICAL EXAMINATION:  VITAL SIGNS: Temperature 97.6, pulse 91, respirations  20, blood pressure 90/57, pulse oximetry 93%.   GENERAL: He is a 64 year old African American male no acute distress, fatigued.   HEENT: Normocephalic, atraumatic. There is a mild scleral icterus. Nose: Septum midline. Oropharynx: Fair dentition.   NECK: No JVD.   HEART: Regular rate and rhythm.   LUNGS: Clear on the right, on the left there is a marked decrease of lung sounds.   ABDOMEN: Only mildly distended. The amount of ascites apparently present is less than when I saw him in the office last week or the week before. He is nontender. Bowel sounds are positive, normoactive. There is no apparent organomegaly or masses felt.   RECTAL: Anorectal exam deferred.   EXTREMITIES: No clubbing, cyanosis, or edema.   NEUROLOGICAL: Cranial nerves II through XII grossly intact. Muscle strength bilaterally equal and symmetric. Deep tendon reflexes bilaterally equal and symmetric.   LABORATORY, DIAGNOSTIC AND RADIOLOGICAL DATA: Laboratories from admission show glucose 124, BUN 8, creatinine 0.90, sodium 129, potassium 4.2, chloride 97, bicarbonate 21, lipase 153. He has had a hepatic profile showing total protein 9.4, albumin 1.7, total bilirubin 4.2, alkaline phosphatase 104, AST 56, ALT 27. He has had one set of cardiac enzymes with a troponin I of 0.07, CPK-MB 0.5. Hemogram on admission showed white count 18.3, hemoglobin and hematocrit 10.8/31.4, platelet count 257. MCV 107. He had INR on admission of 2.6, this in comparison with the outpatient INR of 1.7 on 12/14/2011. He also on that date had alpha fetoprotein tumor marker minimally elevated at 9.6. He had a urinalysis during this hospitalization which was negative for protein. His most recent hemogram showed a white count of 19.0, hemoglobin and hematocrit of 8.0/23.6, platelet count 270. He had a CT scan of the chest with contrast showing a moderate sized left plural effusion with a small right pleural effusion, large amount of ascites. There was  the impression of hemorrhage in the chest wall laterally on the left versus solid mass.   ASSESSMENT:  1. Child Pugh class C cirrhosis in the setting of history of known hepatitis C and chronic alcohol abuse. He has been abstinent now for about three months. Outpatient medications as noted above.  2. History of chronic constipation.  3. Admission with large hematoma left lateral chest from a fall.  4. It is of note that although his pro time was elevated over that seen in the outpatient clinic this may be due to consumption from the hemorrhage itself. His ascites in the past has responded well to diuretics and would continue diuretics as we have right now. With his pro time as high as it is currently he would not be a good candidate for paracentesis. He has had some amount of altered mental status/mentation changes. It is quite likely this is related to ammonia. I have discussed this further with Dr. Allena Katz. His ammonia two days ago was 29, however, with resorption of the large clot over his chest this may increase. Would  recommend recheck today. It is of note that the patient's total protein was 9.4 on admission. Would recommend SPEP and UPEP for further evaluation.    ____________________________ Christena DeemMartin U. Truett Mcfarlan, MD mus:cms D: 02/23/2012 15:48:20 ET T: 02/23/2012 16:07:01 ET JOB#: [id number]  cc: Christena DeemMartin U. Maleta Pacha, MD, <Dictator> Christena DeemMARTIN U Kimi Kroft MD ELECTRONICALLY SIGNED 02/27/2012 11:55

## 2014-08-05 NOTE — Consult Note (Signed)
Brief Consult Note: Diagnosis: Cirrhosis secondary to ETOH abuse and chronic hepatitis C.   Patient was seen by consultant.   Consult note dictated.   Recommend further assessment or treatment.   Orders entered.   Discussed with Attending MD.   Comments: Please seee full GI consult [id number].  Electronic Signatures: Barnetta ChapelSkulskie, Martin (MD)  (Signed [phone]-Nov-13 15:49)  Authored: Brief Consult Note   Last Updated: 07-Nov-13 15:49 by Barnetta ChapelSkulskie, Martin (MD)

## 2014-08-05 NOTE — Consult Note (Signed)
Comments   I met with pt and his family (son Lennette Bihari, daughter Donella Stade and sister). Explained pt's medical condition to family. They understand that pt may not survive. I told them about pt's decision to be a DNR and they are in agreement with this. Family expressed appreciation for meeting. All questions answered.   Electronic Signatures: Lewanda Perea, Izora Gala (MD)  (Signed [phone] 15:48)  Authored: Palliative Care   Last Updated: 08-Nov-13 15:48 by Kenslee Achorn, Izora Gala (MD)

## 2014-08-05 NOTE — Consult Note (Signed)
PATIENT NAME:  Jake Cobb, Jake Cobb MR#:  [id number] DATE OF BIRTH:  07/07/1950  DATE OF CONSULTATION:  02/21/2012  REFERRING PHYSICIAN:   CONSULTING PHYSICIAN:  Sahaj Bona A. Raziya Aveni, MD  REASON FOR CONSULTATION: Left lateral back swelling and abscess.   HISTORY OF PRESENT ILLNESS: Jake Cobb is a pleasant 64 year old African American gentleman who has a history of hepatitis C, cirrhosis compounded with alcohol abuse, ascites, coronary artery disease and chronic obstructive pulmonary disease who presents with left back pain x4 days. He said that he woke up 4 days abo and moved and felt a tearing sensation in his left back and for the next three days he was unable to move. It was better today so he presented to the ED. He denies any obvious trauma. Otherwise, no fevers, chills, shortness of breath, cough, chest pain, nausea, vomiting although he does say his appetite is decreased since he has been on water pills. He has lost 40 pounds in part due to the fact that he has been on diuretics. Also reports that he is constipated, usually can go days without having bowel movements. He has no dysuria, hematuria. He had a CT scan which showed a large collection of probable fluid in his left chest as well as a probable reactive effusion in his left chest.   PAST MEDICAL HISTORY:  1. Hepatitis C.  2. Alcohol abuse.  3. Carotid stent placement on Plavix.  4. Hypercholesterolemia.  5. Hypertension.  6. Depression.  7. Anxiety.  8. Posttraumatic stress disorder.  9. Rheumatoid arthritis.   ALLERGIES: Aspirin, doxycycline and sulfa.   MEDICATIONS:  1. Spironolactone.  2. Pravastatin.  3. Omeprazole.  4. Nitrostat.  5. Metoprolol.  6. Hydrochlorothiazide.  7. Plavix.   SOCIAL HISTORY: Jake Cobb has not drank since August 2013. Has had hepatitis since 2000 and continued since then. No recent smoking for the past seven years. Denies any other drug use. 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   FAMILY HISTORY: Father died of coronary artery disease and MI at age 69, mother of chronic kidney disease and Alzheimer's at age 31.   REVIEW OF SYSTEMS: Total 12 point review of systems was obtained and pertinent positives and negatives are as above.   PHYSICAL EXAMINATION:  VITAL SIGNS: Afebrile, pulse 92, blood pressure 95/63, saturations are 97% on 2 liters oxygen.   GENERAL: No acute distress. Alert and oriented x3. He appears to be mild to moderately poorly nourished with cachexia.   HEAD: Normocephalic, atraumatic. No obvious scleral icterus. Extraocular motions intact.   FACE: No obvious trauma. Normal external nose. Normal external ears.   LUNGS: Decreased breath sounds both bases, moving comfortably, no retractions.   HEART: Regular rate and rhythm. No murmurs, rubs, or gallops.   EXTREMITIES: Moving all extremities well. Strength 5/5 in all extremities.   ABDOMEN: Soft, distended with ascites, nontender. No obvious hernias.   BACK: Has a large swelling, appears to be from craniocaudally above his scapula and extending below that. It is relatively nontender, not warm. There is no obvious induration or erythema.   LABORATORY, DIAGNOSTIC AND RADIOLOGICAL DATA: Labs are significant for white cell count 18.3, hemoglobin and hematocrit 10.8/31.4, platelets 257. INR 2.6. Bilirubin 4.2, albumin 1.7. BMP significant for serum sodium 129, chloride 97. Creatinine 0.9.   I have personally reviewed his CT scan.  CT scan shows chest wall edema of soft tissues and muscle and potentially blood within the muscle sheaths. There is no  obvious drainable fluid collection, no air. There is an infiltrate in his left chest which is potentially reactive.   ASSESSMENT AND PLAN: Jake Cobb is a pleasant 64 year old with a probable hematoma versus edema of the back muscles. No drainable fluid collection. Will check serial hemoglobin and hematocrit. If hemoglobin and hematocrit drops  would reverse coagulopathy and give platelets. No obvious surgical indications this time. No obvious bleeding at this time. White cell count is 18,000 but I do not believe this collection is infected.   ____________________________ Si Raiderhristopher A. Marimar Suber, MD cal:cms D: 02/21/2012 17:46:47 ET T: 02/22/2012 08:25:31 ET JOB#: [id number]  cc: Cristal Deerhristopher A. Breana Litts, MD, <Dictator>  Jarvis NewcomerHRISTOPHER A Rogena Deupree MD ELECTRONICALLY SIGNED 02/26/2012 21:30

## 2014-08-05 NOTE — Consult Note (Signed)
Chief Complaint:   Subjective/Chief Complaint Feels OK. Toleratin diet well.   VITAL SIGNS/ANCILLARY NOTES: **Vital Signs.:   09-Nov-13 05:41   Vital Signs Type Routine   Temperature Temperature (F) 97.9   Celsius 36.6   Temperature Source Oral   Pulse Pulse 115   Respirations Respirations 18   Systolic BP Systolic BP 104   Diastolic BP (mmHg) Diastolic BP (mmHg) 64   Mean BP 77   Pulse Ox % Pulse Ox % 93   Pulse Ox Activity Level  At rest   Oxygen Delivery 2L   Brief Assessment:   Additional Physical Exam Abdomen is somewhat distended. Bowel sounds positive. No rebound. Chest wall unremarkable.   Lab Results: Routine Chem:  09-Nov-13 00:00    Result Comment CBC - SMEAR SCANNED  Result(s) reported on 25 Feb 2012 at 12:48AM.  Routine Coag:  09-Nov-13 00:00    Prothrombin  24.3   INR 2.2 (INR reference interval applies to patients on anticoagulant therapy. A single INR therapeutic range for coumarins is not optimal for all indications; however, the suggested range for most indications is 2.0 - 3.0. Exceptions to the INR Reference Range may include: Prosthetic heart valves, acute myocardial infarction, prevention of myocardial infarction, and combinations of aspirin and anticoagulant. The need for a higher or lower target INR must be assessed individually. Reference: The Pharmacology and Management of the Vitamin K  antagonists: the seventh ACCP Conference on Antithrombotic and Thrombolytic Therapy. Chest.2004 Sept:126 (3suppl): [id number]. A HCT value >55% may artifactually increase the PT.  In one study,  the increase was an average of 25%. Reference:  "Effect on Routine and Special Coagulation Testing Values of Citrate Anticoagulant Adjustment in Patients with High HCT Values." American Journal of Clinical Pathology 2006;126:400-405.)  Routine Hem:  09-Nov-13 00:00    WBC (CBC)  21.6   RBC (CBC)  2.20   Hemoglobin (CBC)  7.7   Hematocrit (CBC)  21.9   Platelet  Count (CBC) 196   MCV 99   MCH  35.0   MCHC 35.2   RDW  18.3   Neutrophil % 65.9   Lymphocyte % 19.5   Monocyte % 13.0   Eosinophil % 0.5   Basophil % 1.1   Neutrophil #  14.2   Lymphocyte #  4.2   Monocyte #  2.8   Eosinophil # 0.1   Basophil #  0.2   Assessment/Plan:  Assessment/Plan:   Assessment Cirrhosis. Coagulopathy, better after FFP. Blood loss anemia better after PRBC transfusion.    Plan Follow H and H. If signs of further blood loss, consider reversing coagulopathy further by FFP as well as additional PRBC transfusion. Will follow.   Electronic Signatures: Lurline DelIftikhar, Sylena Lotter (MD)  (Signed [phone]-Nov-13 09:42)  Authored: Chief Complaint, VITAL SIGNS/ANCILLARY NOTES, Brief Assessment, Lab Results, Assessment/Plan   Last Updated: 09-Nov-13 09:42 by Lurline DelIftikhar, Kashon Kraynak (MD)

## 2014-08-08 NOTE — H&P (Signed)
PATIENT NAME:  Jake Cobb, Jake Cobb MR#:  [id number] DATE OF BIRTH:  [birth year]  DATE OF ADMISSION:  09/17/2012  HISTORY OF PRESENT ILLNESS: Jake Cobb is a 64 year old black male with chronic active hepatitis C and alcoholic cirrhosis with refractory ascites, coagulopathy, history of gastrointestinal bleeding, esophageal varices and portal hypertension, who is admitted for an abdominal Pleurx catheter placement for his refractory ascites. He has had his esophageal varices banded and his last therapeutic paracentesis was approximately 2 weeks ago on May 20. He is followed at home by hospice and when his abdominal distention gets severe, he has increased weakness, reduced oral intake and some shortness of breath.   PAST MEDICAL HISTORY:  Alcoholic plus chronic active hepatitis C cirrhosis (Child's class C), refractory ascites, coagulopathy, previously banded esophageal varices, history of gastroesophageal bleeding, hyponatremia, coronary artery disease, hypertension, dyslipidemia, depression, anxiety, posttraumatic stress disorder, chronic tobacco abuse, history of alcohol abuse, hemorrhoids.   MEDICATIONS: Cipro 500 mg twice weekly, Lasix 20 mg q.a.m., vitamin K 5 mg, nadolol 20 mg daily, omeprazole 20 mg daily, Zofran 4 mg q.6 h. p.r.n., spironolactone 25 mg daily, Ambien 5 mg at bedtime p.r.n. insomnia.   ALLERGIES: ASPIRIN, DOXYCYCLINE, SULFA DRUGS.   REVIEW OF SYSTEMS: Positive for abdominal distention and tightness associated with generalized weakness decline and some shortness of breath and early satiety. He denies nausea, vomiting, diarrhea, constipation, bloody stools and melena.  His review of systems is otherwise negative for 10 systems except as mentioned in the history of present illness above.   PHYSICAL EXAMINATION: GENERAL: An extremely cachectic middle-aged black male who is sitting upright in the hospital bed with huge abdominal girth and eating dinner in no obvious distress.  VITAL SIGNS:  Height 6 feet 7 inches, weight 186.5 pounds, BMI 21.0. Temperature 97.6, pulse 91, respirations 18, blood pressure 94/70, oxygen saturation 99% on room air at rest.  HEENT: Pupils are equally round and reactive to light. Extraocular movements intact. Sclerae anicteric. Oropharynx clear. Mucous membranes moist. Hearing intact to voice.  NECK: Supple with no tracheal deviation or jugular venous distention.  HEART: Regular rate and rhythm with no murmurs or rubs.  LUNGS: Clear to auscultation with normal respiratory effort bilaterally.  ABDOMEN: Extremely distended and tense but nontender.  EXTREMITIES: Mild bilateral lower extremity edema.  NEUROLOGIC: Cranial nerves II through XII, motor and sensation grossly intact.  PSYCHIATRIC: Alert and oriented x 4 with appropriate affect.   ASSESSMENT: A patient with end-stage liver disease who is not a candidate for liver transplantation and has intractable ascites and is cared for at home by hospice.   PLAN: Admit to hospital, check coags, administer fresh frozen plasma as needed and perform Pleurx catheter placement tomorrow. The patient understands the potential risks of bleeding, infection, and catheter malfunction and wishes to proceed.    ____________________________ Claude MangesWilliam F. Earnesteen Birnie, MD wfm:jm D: 09/17/2012 18:00:34 ET T: 09/17/2012 19:09:06 ET JOB#: [id number]  cc: Claude MangesWilliam F. Keni Elison, MD, <Dictator> Ned GraceNancy Phifer, MD Claude MangesWILLIAM F Aliana Kreischer MD ELECTRONICALLY SIGNED 09/18/2012 7:38

## 2014-08-08 NOTE — Op Note (Signed)
PATIENT NAME:  Jake Cobb, Yordy MR#:  [id number] DATE OF BIRTH:  1950/09/05  DATE OF PROCEDURE:  08/09/2012  PREOPERATIVE DIAGNOSES: 1.  Hypotension.  2.  Gastrointestinal bleed.  3.  Cirrhosis.  4.  Hepatitis C.  5.  Coronary artery disease.  6.  Hypertension.   POSTOPERATIVE DIAGNOSES:  1.  Hypotension.  2.  Gastrointestinal bleed.  3.  Cirrhosis.  4.  Hepatitis C.  5.  Coronary artery disease.  6.  Hypertension.   PROCEDURES PERFORMED: 1.  Ultrasound guidance for vascular access, right basilic vein.  2.  Placement of a right basilic peripherally inserted central venous catheter to 40 cm with a triple lumen PICC line.   SURGEON: Annice NeedyJason S. Dew, M.D.   ANESTHESIA: Local.   ESTIMATED BLOOD LOSS: Minimal.   INDICATION FOR PROCEDURE: A 64 year old male with the above-mentioned issues. He is admitted to the CCU with hypotension and GI bleed and has poor venous access. We are asked to place a triple lumen PICC line.   DESCRIPTION OF PROCEDURE: The patient's right arm was sterilely prepped and draped and a sterile surgical field was created. The right basilic vein was visualized with ultrasound and found to be patent. It was accessed under direct ultrasound guidance without difficulty with a micropuncture needle. An 0.018 wire was then placed. Using anatomic landmarks, the catheter was cut to about 40 cm in length, placed over the wire and the wire was removed. All 3 lumens withdrew blood well and flushed easily with heparinized saline and it was secured at the 40 cm mark at the skin. ____________________________ Annice NeedyJason S. Dew, MD jsd:sb D: 08/09/2012 13:21:42 ET T: 08/09/2012 13:42:06 ET JOB#: [id number]  cc: Annice NeedyJason S. Dew, MD, <Dictator> Annice NeedyJASON S DEW MD ELECTRONICALLY SIGNED 08/13/2012 9:30

## 2014-08-08 NOTE — Consult Note (Signed)
Pt seen and examined. Please see Wilhelmenia BlaseKaryn Earle's notes. No further bleeding since admission. Ascites present. EGD showed grade 3 esophageal varices. 5 bands placed. Esophageal candidiasis seen. Cytology brushings done. Ordered diflucan. Mild portal gastropathy and food residue in stomach. Continue protonix iv. Continue octreotide infusion. Clear liquid diet rest of today. Diuretics or paracentesis once BP stable. Will follow. Thanks.  Electronic Signatures: Lutricia Feilh, Gunnison Chahal (MD)  (Signed on 24-Apr-14 15:22)  Authored  Last Updated: 24-Apr-14 15:22 by Lutricia Feilh, Morgyn Marut (MD)

## 2014-08-08 NOTE — Consult Note (Signed)
PATIENT NAME:  Jake Cobb, Jake Cobb MR#:  [id number] DATE OF BIRTH:  [birth year]  DATE OF CONSULTATION:  05/12/2012  CONSULTING PHYSICIAN:  Scot Junobert T. Kaige Whistler, MD  HISTORY OF PRESENT ILLNESS: The patient is a 64 year old black male who was admitted because of diarrhea and rectal bleeding. He has had several episodes of rectal bleeding. He thinks he might have bled a pint in all. He was noted to have low blood pressure in the ER and there was a decision made to admit him to the hospital, and because of the pressure and the GI bleed and his history of liver disease, he was admitted to the Intensive Care Unit. I was asked to see him in consultation.   The patient has known hepatitis C and alcohol abuse as a basis for his cirrhosis of the liver. He was hospitalized in November for a week for a chest wall hematoma that came about after a fall. At that time, his INR was running in the 3 range.   PAST MEDICAL HISTORY: Includes COPD, coronary artery disease, ascites, hypertension, hyperlipidemia, chronic anxiety.   ALLERGIES: SULFA, ASPIRIN DOXYCYCLINE   MEDICATIONS AT HOME: Include Ambien 5 mg p.o. daily, Toprol-XL 25 mg a day, oxycodone 5 mg q.i.d. p.r.n. for pain, MiraLax as needed, Aldactone 50 mg a day, Combivent Respimat 1 puff 4 times a day, Ensure 1 can b.i.d., Nitrostat sublingual p.r.n. for chest pain, omeprazole 20 mg a day.   SOCIAL HISTORY: He used to be a heavy drinker, quit in August. Quit smoking 7 years ago.   FAMILY HISTORY: Positive for coronary artery disease, Alzheimer's and kidney disease.   REVIEW OF SYSTEMS: Negative except for the following: He does complain of easy fatigue, arthritis, anxiety and diarrhea.   PHYSICAL EXAMINATION:  GENERAL: Black male, looks older than stated age.  VITAL SIGNS: Blood pressure 96/68, pulse 76, respirations 14, O2 sat 99% on 2 liters.  HEENT: Head is atraumatic. Sclerae anicteric. Conjunctivae negative. Tongue negative.  CHEST: Clear in anterolateral  fields.  HEART: No murmurs or gallops that I can hear.  ABDOMEN: Somewhat distended, likely from ascites. I cannot palpate any internal organs. No bruits.  RECTAL: Exam done by the ER shows bright blood per rectum.   LABORATORY DATA:  PT of 22.5 with INR of 1.9. O positive blood with a negative antibody screen. Urinalysis shows 48 white cells per high-power field, 18 red cells per high-power field. White count 6.8, hemoglobin 12.9, hematocrit 37, platelet count 239, total protein 10, albumin 1.9, total bilirubin 2, alkaline phosphatase 136, SGOT 93, SGPT 36. Sodium 129, calcium 7.8, BUN 6, glucose 118. GI blood loss scan shows findings consistent with a large amount of ascites, no active bleeding at this time.   ASSESSMENT AND PLAN: The patient had diarrhea and gastrointestinal bleeding. It is likely that he had possibly an infectious diarrhea and then bleeding from internal hemorrhoids. However, because of his cirrhosis, other sources certainly could be the cause. For now, he has stopped bleeding. We will continue his intravenous fluids and observation for now. This does not appear to be related to any upper gastrointestinal bleeding at this time, so we will hold off on octreotide.    ____________________________ Scot Junobert T. Benen Weida, MD rte:jm D: 05/12/2012 18:29:06 ET T: 05/12/2012 20:13:13 ET JOB#: [id number]  cc: Scot Junobert T. Sharyah Bostwick, MD, <Dictator> Scot JunOBERT T Yolani Vo MD ELECTRONICALLY SIGNED 06/07/2012 7:53

## 2014-08-08 NOTE — Consult Note (Signed)
   Comments   Complete H&P dictated.  Pt is a 64 yo AA man with alcohol-induced cirrhosis, refractory ascites, coagulopathy, HCV, CAD, and COPD. He last had therapeutic paracentesis during hospitalization in 07/2012 with approx 2L and 4.7L removed. He has been at home followed by hospice with increasing weakness, abd distension, and poor oral intake. Pt has been evaluated by Dr Anda KraftMarterre for insertion of a pleur-x cath which is scheduled on 09/17/12. Pt comes in today for therapeutic paracentesis.  refractory ascites - will schedule therapeutic paracentesis via radiology. aquired coagulopathy - will give 2 units FFP. Continue vit k. CBC pending. dispo - will likely discharge home following procedure.    Electronic Signatures for Addendum Section:  Phifer, Harriett SineNancy (MD) (Signed Addendum 20-May-14 16:52)  I reviewed pt's records, examined pt with Elouise MunroeJosh Neville Walston, NP, and discussed with him in detail. Agree with assessment and plan as outlined in above note. See complete H&P dictated by Elouise MunroeJosh Samyak Sackmann, NP. Pt admitted for therapeutic paracentesis for intractable ascites secondary to cirrhosis. Will get FFP prior to and during procedure.   Electronic Signatures: Teegan Brandis, Daryl EasternJoshua R (NP)  (Signed 20-May-14 09:36)  Authored: Palliative Care   Last Updated: 20-May-14 16:52 by Phifer, Harriett SineNancy (MD)

## 2014-08-08 NOTE — Consult Note (Signed)
Chief Complaint:  Subjective/Chief Complaint No further bleeding. S/P esophageal banding. Clear liquid diet started.   VITAL SIGNS/ANCILLARY NOTES: **Vital Signs.:   25-Apr-14 12:02  Vital Signs Type 15 min Post Blood Start Time  Temperature Temperature (F) 97.2  Celsius 36.2  Temperature Source oral  Pulse Pulse 84  Pulse source if not from Vital Sign Device per cardiac monitor  Respirations Respirations 16  Systolic BP Systolic BP 127  Diastolic BP (mmHg) Diastolic BP (mmHg) 83  Mean BP 97  Pulse Ox % Pulse Ox % 89  Oxygen Delivery Room Air/ 21 %   Brief Assessment:  Cardiac Regular   Respiratory clear BS   Gastrointestinal Ascites. nontender   Lab Results: Routine BB:  25-Apr-14 10:50   Fresh Frozen Plasma Unit 1 Issued  Fresh Frozen Plasma Unit 2 Work In Progress  Result(s) reported on 10 Aug 2012 at 11:58AM.  Routine Coag:  25-Apr-14 04:56   Activated PTT (APTT)  53.0 (A HCT value >55% may artifactually increase the APTT. In one study, the increase was an average of 19%. Reference: "Effect on Routine and Special Coagulation Testing Values of Citrate Anticoagulant Adjustment in Patients with High HCT Values." American Journal of Clinical Pathology 2006;126:400-405.)   Assessment/Plan:  Assessment/Plan:  Assessment Esophageal varices. Banded. Acites but INR elevated.   Plan Can advance diet gradually. Order soft diet when ready for solids. Start weaning off octreotide and then switch to either inderal or nadolol. As ascites increases, start low dose diuretics since radiology unlikely to drain ascites. Dr. Marva PandaSkulskie will see patient over the weekend. Thanks.   Electronic Signatures: Lutricia Feilh, Kaydin Labo (MD)  (Signed 25-Apr-14 12:35)  Authored: Chief Complaint, VITAL SIGNS/ANCILLARY NOTES, Brief Assessment, Lab Results, Assessment/Plan   Last Updated: 25-Apr-14 12:35 by Lutricia Feilh, Matayah Reyburn (MD)

## 2014-08-08 NOTE — Consult Note (Signed)
CC: LGI bleed.  No further bleeding.  Small hemorrhoids seen at anal opening by spreading his buttocks.  abd exam same with ascites.  WBC 6.4, plt 180, Na 131.  Await move to floor, tol soft diet. No new recommendations.  Electronic Signatures: Scot JunElliott, Peg Fifer T (MD)  (Signed on 26-Jan-14 13:25)  Authored  Last Updated: 26-Jan-14 13:25 by Scot JunElliott, Giannie Soliday T (MD)

## 2014-08-08 NOTE — H&P (Signed)
PATIENT NAME:  Jake Cobb, Jake Cobb MR#:  [id number] DATE OF BIRTH:  [birth year]  DATE OF ADMISSION:  05/12/2012  PRIMARY DOCTOR: From [facility], Donell Sievert, MD.  GASTROINTESTINAL DOCTOR: Christena Deem, MD   EMERGENCY ROOM PHYSICIAN: Humberto Leep. Margarita Grizzle, MD   CHIEF COMPLAINT: Rectal bleeding.   HISTORY OF PRESENT ILLNESS: A 64 year old African-American male with history of liver cirrhosis due to hepatitis C and alcohol abuse and history of COPD and coronary artery disease, who came in because of rectal bleeding. The patient noticed bright red from the rectum since yesterday morning. He had 1 episode around 4:00, a cupful of blood came from his rectum, and this morning, he had another episode around 4:00 a.m., bright red blood from rectum with clots. The patient had another episode after 2 hours, around 6:00 a.m., almost 1 pint of blood he lost, according to him. He feels a little dizzy. The patient denies abdominal pain. No vomiting. He was evaluated in the ER, and I was asked to admit for rectal bleeding.   The patient was hospitalized in November, from November 5th to 12th. At that time, he had chest wall hematoma, had history of blood transfusions and was discharged to home with physical therapy, and he was made do not resuscitate at that time. The patient has appointment with Dr. Marva Panda next month, on February 3rd.   PAST MEDICAL HISTORY: Significant for history of hyperlipidemia, hypertension, anxiety, hepatitis C, alcohol liver disease with cirrhosis, ascites, history of coronary artery disease, COPD.  ALLERGIES: TO ASPIRIN, DOXYCYCLINE AND SULFA.  HOME MEDICATIONS:  1. Aldactone 50 mg p.o. daily.  2. Combivent Respimat 1 puff 4 times daily. 3. Ensure 1 can p.o. b.i.d. as needed.  4. Nitrostat sublingual p.r.n. for chest pain.  5. Omeprazole 20 mg daily.  6. Oxycodone 5 mg 4 times daily as needed for pain.  7. MiraLax.  8. Toprol-XL 25 mg daily.  9. Ambien 5 mg p.o. daily.      SOCIAL HISTORY: The patient was a heavy drinker, quit in August. Also, has no history of smoking now, he quit 7 years ago. No drug abuse.   FAMILY HISTORY: Significant for coronary artery disease in father and MI, and mother had history of kidney disease and Alzheimer's dementia.   REVIEW OF SYSTEMS:  GENERAL: Complains of fatigue.  EYES: No blurred vision.  ENT: No tinnitus. No ear pain,  RESPIRATIONS: No cough. No wheezing.  CARDIOVASCULAR: No chest pain.  GASTROINTESTINAL: Had rectal bleeding for the last 2 days. Denies nausea or vomiting. Appetite is good.  GENITOURINARY: Has no dysuria.  ENDOCRINE: No polyuria or nocturia.  SKIN: No skin rashes.  MUSCULOSKELETAL: Has history of arthritis and back pain.  PSYCHIATRIC: Has history of anxiety.   PHYSICAL EXAMINATION:  VITALS: The patient's temperature 97.4, heart rate 86, blood pressure 82/69, sat is 97% on room air.  GENERAL: The patient is alert, awake and oriented cachectic male, not in distress. The patient does have pallor.  HEENT: Atraumatic, normocephalic. Pupils equally reacting to light. Extraocular movements  intact. Oral mucosa is dry.  NECK: Supple. No JVD.  RESPIRATIONS: The patient's breath sounds are clear to auscultation bilaterally. No wheezing.  CARDIOVASCULAR: S1, S2 regular. PMI not displaced.  EXTREMITIES: Good pedal pulses present. No extremity edema.  ABDOMEN: Soft. Mild ascites present. Bowel sounds present. Nontender.  NEUROLOGIC: Alert, awake, oriented. Cranial nerves II through XII intact. Power 5/5 in upper and lower extremities. Sensation intact. DTR 2+ bilaterally. No focal neurological  deficit.  PSYCHIATRIC: Mood and affect are within normal limits.   LABORATORY DATA: WBC 6.8, hemoglobin 12.9, hematocrit 37.2, platelets 239. Lipase 383. Electrolytes: Sodium 129, potassium 4.5, chloride 99, bicarbonate 23, BUN 6, creatinine 0.76, glucose 118,. INR 1.9. Hemoglobin at last discharge on November 12th was  8.3. The patient's sodium on November 12th was 124.   ASSESSMENT AND PLAN:  1. The patient is a 64 year old male with history of alcohol liver disease, cirrhosis and ascites who came in with rectal bleeding, likely due to hemorrhoids, but also need to rule out diverticular bleed versus polyps. The patient is going to be admitted to ICU because he is also hypotensive. Continue IV fluids, normal saline, and check CBC q.12 hours. I spoke with Dr. Mechele CollinElliott, recommended starting IV PPIs b.i.d. and also get a nuclear medicine bleeding scan to see the source of bleeding. Possible infectious colitis. He is already on Cipro and Flagyl. Continue them. 2. History of hepatitis C with alcohol liver cirrhosis, ascites. Hold Aldactone and Toprol-XL because he is hypotensive.  3. Chronic hyponatremia. Monitor the sodium closely. Probably secondary to fluid overload with ascites.  4. The patient's diagnoses include history of coronary artery disease. The patient has been taken off the Plavix at the last discharge, and he is already anticoagulated. INR 1.9.  5. The patient's other diagnoses include chronic obstructive pulmonary disease. Right now, he is stable. Continue Combivent.   TOTAL TIME SPENT ON HISTORY AND PHYSICAL: About 50 minutes, this is critical care history and physical   CODE STATUS: Do not resuscitate. He was made do not resuscitate during the last admission, after discussing with him, and he has been followed with hospice.   Discussed the plan with the patient and the patient's son.   ____________________________ Katha HammingSnehalatha Sadie Hazelett, MD sk:OSi D: 05/12/2012 11:45:22 ET T: 05/12/2012 12:28:27 ET JOB#: [id number]  cc: Katha HammingSnehalatha Randalyn Ahmed, MD, <Dictator> Katha HammingSNEHALATHA Michalina Calbert MD ELECTRONICALLY SIGNED 06/12/2012 9:52

## 2014-08-08 NOTE — Discharge Summary (Signed)
PATIENT NAME:  Jake Cobb, Jake Cobb MR#:  [id number] DATE OF BIRTH:  08-25-1950  DATE OF ADMISSION:  05/12/2012 DATE OF DISCHARGE:  05/14/2012  DISCHARGE DIAGNOSES: 1. Rectal bleeding secondary to hemorrhoids.  2. 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 3. Coronary artery disease with stent. 4. 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  5. Hepatitis C.  6. Hypertension; the patient had episodes of hypotension in the hospital.   DISCHARGE MEDICATIONS: 1. Zofran 4 mg p.o. every 6 hours p.r.n. for vomiting.  2. Ambien 5 mg as needed for insomnia. 3. Ensure one can p.o. 3 times daily. 4. Omeprazole 20 mg p.o. daily.  5. MiraLAX as needed for constipation.  6. Oxycodone 5 mg 4 times a day as needed for painm 8. Cipro 500 mg twice daily.  9. Aldactone 50 mg p.o. twice a day.  10. Lasix 20 mg daily. Note that Lasix and Aldactone doses have been decreased from the home medication list.   DISPOSITION: The patient has been discharged with hospice and he is advised to take Nitrostat for chest pain as needed. The patient does have an appointment with Dr. Marva PandaSkulskie today and he will followup regarding his alcoholic liver disease and also rectal bleeding.   CONSULTANT: Lynnae Prudeobert Elliott, MD - Gastroenterology.  HOSPITAL COURSE:  1. The patient is a 64 year old male patient with history of alcoholic liver disease, ascites and cirrhosis with previous hospitalization complicated by chest wall hematoma requiring transfusions who came in with rectal bleeding. Look at the history and physical for full details. The patient was admitted for rectal bleeding. Had mild abdominal pain but no fever, no nausea. He was admitted for rectal bleeding and started on IV fluids. The patient had fresh blood from rectum so Protonix and octreotide were not started. Dr. Mechele CollinElliott suggested a bleeding scan. On admission the patient's hemoglobin was 12.9 and hematocrit 37.2. The patient's bleeding scan is negative  for acute active bleeding. The patient was monitored in the intensive care unit for a day and Dr. Mechele CollinElliott suggested it could be hemorrhoidal bleeding. The patient did not require any transfusions and hemoglobin stayed around 9.9. At the time of discharge, the patient did not have any further episodes of bleeding, tolerated the diet.  2. Hypotension. The patient's blood pressure was low when he came and we had to hold his Aldactone and Lasix because of hypotension. Blood pressure was 88/56 which improved with IV fluids and at the time of discharge blood pressure is 110/71. He was taking Aldactone 50 mg daily and also Lasix 40 mg 2 times a day p.o. Those medicines were held because of hypotension and I decrease the dose at time of discharge.  3. Hypertension and coronary artery disease. The patient had a stent. The patient was taken off Plavix during the last discharge. Anyway he is self-anticoagulated with INR about 1.6 so he is advised not to take Plavix anymore. The patient also is taking Toprol-XL, but his blood pressure is running borderline low so that has been discontinued.  4. Hyponatremia. Secondary to volume depletion, improved with the fluids. On admission sodium was 129 and creatinine 0.76. Sodium repeat was improved with hydration and repeat sodium is 131.   The patient is tolerating the diet and he was followed by hospice and was made DNR during the last admission. I spoke with hospice nurse to give her Toprol-XL only if the blood pressure is up at 100 and also give Lasix and Aldactone if blood pressure permitted. The patient is advised to  not to drink  alcohol anymore and also report to the ER if he has any rectal bleeding or vomiting of the blood. His condition at the time of discharge is stable.   TIME SPENT ON DISCHARGE PREPARATION: More than 30 minutes.  ____________________________ Katha Hamming, MD sk:sb D: 05/18/2012 07:43:59 ET T: 05/18/2012 12:06:29  ET JOB#: [id number]  cc: Katha Hamming, MD, <Dictator> Katha Hamming MD ELECTRONICALLY SIGNED 05/30/2012 22:18

## 2014-08-08 NOTE — Consult Note (Signed)
Pt finally went down for paracentesis. SBP running low. This limits amount of diuretics patient can be given. Daily high concentrate albumin may prevent rapid reaccumulation of ascites. Pt deteriorating. Will check tomorrow. Thanks   Electronic Signatures: Lutricia Feilh, Marbeth Smedley (MD) (Signed on 01-May-14 14:20)  Authored   Last Updated: 01-May-14 14:21 by Lutricia Feilh, Traivon Morrical (MD)

## 2014-08-08 NOTE — Discharge Summary (Signed)
PATIENT NAME:  Jake Cobb, Jake Cobb MR#:  [id number] DATE OF BIRTH:  Dec 12, 1950  DATE OF ADMISSION:  09/04/2012 DATE OF DISCHARGE:  09/04/2012  DICTATED BY:  Laurette SchimkeJoshua Novaleigh Kohlman, NP  PRIMARY CARE PHYSICIAN:  MD in [place]  PROCEDURE:  Therapeutic paracentesis.  DISCHARGE DIAGNOSES: 1.  Refractory ascites status post paracentesis. 2.  Esophageal varices. 3.  Hypotension. 4.  Liver cirrhosis. 5.  Coagulopathy. 6.  Hepatitis C. 7.  Coronary artery disease. 8.  Chronic obstructive pulmonary disease.  CONDITION:  Fair.  CODE STATUS:  DO NOT RESUSCITATE.  HOME MEDICATIONS: 1.  Zofran 4 mg p.o. q. 6 hours as needed. 2.  Ambien 5 mg p.o. at bedtime as needed. 3.  Omeprazole 20 mg p.o. daily. 4.  Lasix 20 mg p.o. daily. 5.  Cipro 500 mg p.o. twice weekly. 6.  Spironolactone 25 mg p.o. daily. 7.  Nadolol 20 mg p.o. daily.  DIET:  Low-sodium diet.  ACTIVITY:  As tolerated.  FOLLOWUP CARE:  Patient to see Dr. Anda KraftMarterre for scheduled PleurX catheter insertion 09/17/2012.  The patient is hold spironolactone, Lasix and nadolol this evening and tomorrow if systolic blood pressure is less than 100 or diastolic less than 60.  The patient will go home and be discharged under hospice care.  Hospice to followup tomorrow for a routine visit and patient is instructed to utilize triage line this evening for any concerns or changes.   REASON FOR ADMISSION:  Ascites.  HOSPITAL COURSE:  The patient is a 64 year old African American male with history of liver cirrhosis, refractory ascites, coronary artery disease, coagulopathy and COPD who was admitted under observation on 09/04/2012 for refractory ascites and to undergo therapeutic paracentesis.  He was transfused with 2 units FFP due to his acquired coagulopathy with INR 2.0, PTT 48.8, PT 22.7.  The patient tolerated the procedure well and approximately 16 liters were removed during the procedure.  The patient was without note of hemorrhage.  He was noted to  have mild hypotension post-procedure but was relatively asymptomatic and patient opted to discharge home under hospice care instead of spending the night in the hospital.  TIME SPENT:  Approximately 50 minutes.    ____________________________ Malachy MoanJoshua R. Diahann Guajardo, NP jrb:ce D: 09/04/2012 16:35:36 ET T: 09/04/2012 17:45:29 ET JOB#: [id number]  cc: Malachy MoanJoshua R. Raedyn Klinck, NP, <Dictator> Ned GraceNancy Phifer, MD  Malachy MoanJOSHUA R Abbe Bula FNP ELECTRONICALLY SIGNED 09/14/2012 12:20

## 2014-08-08 NOTE — Consult Note (Signed)
Brief Consult Note: Diagnosis: rectal bleed, anemia.   Discussed with Attending MD.   Comments: Went to patient room; patient is down in Ultrasound but was able to discuss with patients friend. Chart reviewed, labs reviewed. Pt has been having increased rectal bleeding, both dark and bright red, x4-5 days. No abdominal pain, no n/v. There is a hx of cirrhosis and hep c, unclear if pt has hx of varices. Agree with octreotide, protonix, serial H&H, be prepared to trasnfuse as necessary. Pt will likely benefit from endoscopic intervention. Please keep NPO after MN tonight and I will see and evaluate the patient first thing in the morning.  Electronic Signatures: Ashok CordiaEarle, Libbi Towner M (PA-C)  (Signed 23-Apr-14 16:49)  Authored: Brief Consult Note   Last Updated: 23-Apr-14 16:49 by Ashok CordiaEarle, Romesha Scherer M (PA-C)

## 2014-08-08 NOTE — H&P (Signed)
PATIENT NAME:  Jake Cobb, Jake Cobb MR#:  [id number] DATE OF BIRTH:  October 29, 1950  DATE OF ADMISSION:  09/04/2012  PRIMARY CARE PHYSICIAN:  None local.  This patient has a primary care physician in [place].  CHIEF COMPLAINT:  Increased weight, increased abdominal girth.  HISTORY OF PRESENT ILLNESS:  This patient is a  64 year old African-American male with multiple medical problems including alcohol-induced liver cirrhosis, refractory ascites, acquired coagulopathy, hepatitis C, COPD,  The patient was last hospitalized 08/08/2012 to 08/17/2012 with GI bleed, anemia, ascites and coagulopathy.  He underwent endoscopy with findings of esophageal varices which were banded.  The patient, during that hospitalization, continued to have refractory ascites and had a therapeutic paracentesis x 2, the first with approximately 2 liters removed and the second with 4.7 liters removed, afterward which he was treated with albumin.  The patient was discharged home with hospice care and was scheduled for follow up by Dr. Anda Kraft with [facility] who saw the patient for evaluation of insertion of a Pleurx catheter for continued management of refractory ascites.  The patient is scheduled to undergo Pleurx catheter insertion on 09/17/2012.  The patient has been followed at home by hospice and has had note of increased abdominal distention, increased weakness, reduced oral intake and comes in today for therapeutic paracentesis.  PAST MEDICAL HISTORY:  Alcohol-induced liver cirrhosis, refractory ascites, acquired coagulopathy, hepatitis C, coronary artery disease status post stenting in April 2023, COPD, hypertension, hyperlipidemia, depression, anxiety, PTSD, chronic tobacco abuse, history of alcohol abuse but quit in 11/2011 and GI bleed.  SOCIAL HISTORY: The patient lives at home alone.  He is divorced from his first wife and his second wife is deceased.  He has a girlfriend, who is also his caregiver and helps take care of him  at night.  The patient has 4 children, 9 sisters and 3 brothers.  He is followed at home by [facility] who provides him nursing and aide support.  FAMILY HISTORY: Father has CAD and has had an MI. Mother has kidney disease and Alzheimer's dementia.   ALLERGIES:  1.  ASPIRIN. 2.  DOXYCYCLINE. 3.  SULFA DRUGS.   HOME MEDICATIONS:  Cipro 500 mg by mouth twice a week, Lasix 20 mg by mouth once a day, vitamin K 5 mg by mouth once, nadolol 20 mg by mouth once daily, omeprazole 20 mg once daily, Zofran 4 mg every 6 hours as needed for nausea and vomiting, spironolactone 25 mg once daily, Ambien 5 mg p.o. once a day at bedtime as needed.    REVIEW OF SYSTEMS:  CONSTITUTIONAL: The patient denies any fever or chills but endorses generalized weakness and decline.  EYES: No visual changes, blurred vision.  He has icterus.  HEENT: No epistaxis, slurred speech, no dysphagia.  CARDIOVASCULAR: No chest pain, palpitation, orthopnea.  Has lower extremity edema. GASTROINTESTINAL: Endorses generalized abdominal pain and increased abdominal distention.  Denies nausea, vomiting, diarrhea or constipation or bloody stools. GENITOURINARY: No dysuria or hematuria. SKIN: No rashes. HEMATOLOGY: No easy bleeding or bruising. ENDOCRINOLOGY: No polyuria, polydipsia. NEUROLOGY: No loss of consciousness or seizure.   PHYSICAL EXAMINATION: VITALS: Blood pressure 102/72, temperature 98.3, heart rate 84, respiratory rate 18, saturation 94% on room air. GENERAL: The patient is alert, awake in no acute distress.  He is cachectic, HEENT: Pupils are round, equal and reactive to light.  He has scleral icterus.  The patient also has temporal wasting, has a clear oropharynx. NECK: Supple.  CARDIOVASCULAR: S1, S2 regular rate,  rhythm without murmur. PULMONARY:  Lungs clear to auscultation without wheeze or rales.  No labored breathing. ABDOMEN:  Diffuse distention, mild tenderness to palpation.  No rebound tenderness.   The patient has a positive fluid wave.   EXTREMITIES: Bilateral lower extremity edema, 1+. SKIN: No rashes or jaundice.  NEUROLOGY: Alert and oriented x 3 without focal deficits. PSYCHIATRIC:  Calm with normal affect.  LABORATORY DATA:  PT 22.7, INR 2.0, PTT 48.8.  ASSESSMENT and PLAN:   1.  Refractory ascites. The patient will have a therapeutic paracentesis per radiology.  We will check CBC and coags and administer 2 units of fresh frozen plasma as the patient has a history of acquired coagulopathy which will need to be corrected prior to pursuing intervention. 2.  Coagulopathy.  We will continue the patient's oral vitamin K, give 2 units of fresh frozen plasma one for the procedure and one during the procedure. 3.  Deep venous thrombosis prophylaxis with TED hose. 4. Disposition:  The patient will likely discharge home with continuation of hospice services after therapeutic paracentesis.   5.  Code Status: He does report that he has an out-of-facility DNR form in his home which was not brought to the hospital today.  His caregiver confirms this. Patient says he wants to be a DNR.  I did discuss the patient's condition and plan of treatment with the patient and his caregiver,  The plan was developed in collaboration with Dr. Harriett SineNancy Phifer.    ____________________________ Malachy MoanJoshua R. Malena Timpone, NP jrb:ct D: 09/04/2012 09:25:00 ET T: 09/04/2012 10:14:54 ET JOB#: [id number]  cc: Malachy MoanJoshua R. Kasandra Fehr, NP, <Dictator> Ned GraceNancy Phifer, MD Malachy MoanJOSHUA R [place] FNP ELECTRONICALLY SIGNED 09/04/2012 15:27

## 2014-08-08 NOTE — Consult Note (Signed)
PATIENT NAME:  Jake Cobb, Jake Cobb MR#:  [id number] DATE OF BIRTH:  01/06/1951  DATE OF CONSULTATION:  08/09/2012  REFERRING PHYSICIAN:      Shaune Pollack, MD CONSULTING PHYSICIAN:   Dictated by Brantley Stage, PA for Ezzard Standing. Bluford Kaufmann, MD  REASON FOR CONSULTATION: Gastrointestinal bleed and anemia.   HISTORY OF PRESENT ILLNESS: This is a pleasant 64 year old gentleman with a past medical history significant for alcohol-induced cirrhosis with underlying hepatitis C who presents with 4 to 5 day history of, hematochezia and bloody stools. There is associated dizziness and weakness with difficulty walking prior to admission. He was in the ER approximately 4 days ago with similar complaints and was found to have a hemoglobin of 10.5. Upon presentation to the ER this admission, his hemoglobin is down to 9.0. He was started on an octreotide drip as well as a PPI b.i.d. 40 mg and was kept n.p.o. status. He has had an EGD and a colonoscopy in the past due to a similar presentation and this a few years ago by Dr. Barnetta Chapel. Abdominal ultrasound was also obtained yesterday evening, notable for changes consistent with cirrhosis. His blood pressure had steadily declined and he was transferred to the CCU earlier this morning. Currently, his blood pressure is 94/13 and his heart rate is 88. His INR is mildly elevated at 2.2. He continues to have bloody bowel movements through the night as well. No nausea or vomiting but he does state a few days ago he did have the taste of blood in his mouth that did self resolve. No fever or chills. No chest pain or shortness of breath.   ALLERGIES: No known drug allergies.   SOCIAL HISTORY: The patient does have a remote history of tobacco use but denies any current use. He does have a history significant for alcohol use that was determined to likely be because of his cirrhosis.   PROBLEM LIST:  Hepatitis C, alcohol use and abuse, [name] class C cirrhosis, dyslipidemia, rheumatoid  arthritis, coronary artery disease, hemorrhoids, COPD, PTSD, anxiety and depression.   PAST SURGICAL HISTORY: Cardiac catheterization and PCI with drug eluting stent.   HOME MEDICATIONS: Multivitamin, Plavix, pravastatin, metoprolol titrate, Nitrostat, Lasix, omeprazole, MiraLAX, Aldactone and Proctosol.   FAMILY HISTORY: Negative for GI malignancy, cirrhosis or colon polyps.   REVIEW OF SYSTEMS:  A 10 system review of systems was obtained on the patient. All pertinent positives are mentioned above and otherwise negative.   PHYSICAL EXAMINATION:   VITAL SIGNS: Currently his blood pressure is 94/13, heart rate is 88.  GENERAL: This is a pleasant 64 year old African American gentleman resting quietly and comfortably in bed in the CCU, alert and oriented x 3 in no acute distress.  HEAD: Atraumatic, normocephalic.  NECK: Supple. No lymphadenopathy noted.  HEENT: Sclerae anicteric. Mucous membranes moist.  LUNGS: Respirations are even and unlabored, clear to auscultation in bilateral anterior lung fields.  CARDIAC: Regular rate and rhythm. S1, S2 noted.  ABDOMEN: Soft, nontender, nondistended. Normoactive bowel sounds noted in all 4 quadrants. No masses palpated. No guarding or rebound.  RECTAL: Deferred.  EXTREMITIES: Negative for lower extremity edema, 2+ pulses noted bilaterally.  PSYCHIATRIC: Appropriate mood and affect.   LABORATORY AND DIAGNOSTIC DATA: White blood cells 8.8, hemoglobin 9, hematocrit 27.1, MCV is 104, platelets 201, INR 2.2, PT 24.9, PTT 49.6. Sodium 130, potassium 4.4, BUN 10, creatinine 0.96, glucose 119, bilirubin 1.4, alkaline phosphatase 91, ALT 26, AST 66.   IMAGING: An ultrasound was obtained on the  patient yesterday evening consistent with liver changes consistent with cirrhosis. Spleen was unremarkable.   ASSESSMENT: 1.  Rectal bleeding/hematochezia.  2. Acute blood loss anemia that is symptomatic. The patient complains of dizziness and weakness upon admission.   3.  Hepatitis C.  4.  History of alcohol-induced cirrhosis this is a Child Pugh class C.  5.  History of coronary artery disease, status post carotid stent placement.   PLAN: I have discussed this patient's case in detail with Dr. Lutricia FeilPaul Oh, who is involved in the development of the patient's plan of care. At this time, we do agree with the patient being maintained n.p.o. status and being maintained on an octreotide drip as well as Protonix therapy. We do recommend checking serial hemoglobins and being prepared to transfuse as necessary. Close monitoring of his vitals is important as well as he did have a decline in his blood pressure through the night. Due to his history of cirrhosis in setting of a GI bleed, we do feel he can benefit from an EGD to rule out an upper GI source and assess for varices. This can be done by Dr. [name] later on today. Alternatives, risks and benefits were discussed in detail with the patient including risk of bleeding, perforation, infection and anesthesia and he verbalized understanding and is in agreement to proceed. We will continue to monitor this patient closely. All questions were answered.   The above was discussed and agreed upon under supervisoratory agreement between myself and Dr. Lutricia FeilPaul Oh, attending gastroenterologist.    ____________________________ Hardie ShackletonKaryn M. Cace Osorto, PA-C kme:ct D: 08/09/2012 09:23:00 ET T: 08/09/2012 10:17:34 ET JOB#: [id number]  cc: Hardie ShackletonKaryn M. Giorgi Debruin, PA-C, <Dictator> Ezzard StandingPaul Y. [name], MD Ashok CordiaKARYN M Meagon Duskin PA ELECTRONICALLY SIGNED 08/13/2012 15:53

## 2014-08-08 NOTE — Consult Note (Signed)
   Comments   Followup visit made. Dr Phifer and I spoke with patient. He tolerated paracentesis well with 16000ml removed. Says he feels good. BP noted. Pt wants to go home. Instructed to hold diuretics and BB tonight. Spoke with hospice RN who will check on pt in the morning. Family in the room and say they are comfortable with him going home. Orders entered.   Electronic Signatures for Addendum Section:  Phifer, Harriett SineNancy (MD) (Signed Addendum 20-May-14 16:54)  Examined pt with Elouise MunroeJosh Borders, NP, and discussed with him in detail. Agree with assessment and plan as outlined in above note.   Electronic Signatures: Borders, Daryl EasternJoshua R (NP)  (Signed 20-May-14 16:53)  Authored: Palliative Care   Last Updated: 20-May-14 16:54 by Phifer, Harriett SineNancy (MD)

## 2014-08-08 NOTE — Discharge Summary (Signed)
PATIENT NAME:  Jake Cobb, MICHAUX MR#:  [id number] DATE OF BIRTH:  Jul 01, 1950  DATE OF ADMISSION:  08/08/2012 DATE OF DISCHARGE:  08/17/2012  PRIMARY CARE PHYSICIAN:  SMS  CONSULTATION:  GI, Dr. Bluford Kaufmann.    PROCEDURES:  EGD, sigmoidoscopy, paracentesis.  DISCHARGE DIAGNOSES:  Gastrointestinal bleeding, esophageal varices, hypotension, liver cirrhosis, ascites, coagulopathy, hyponatremia, hepatitis C, coronary artery disease, hypertension, chronic obstructive pulmonary disease, hemorrhoid.    CONDITION:  Guarded.   CODE STATUS:  DO NOT RESUSCITATE.   HOME MEDICATIONS:  1.  Zofran 4 mg by mouth q. 6 hours as needed.  2.  Zolpidem 5 mg by mouth at bedtime as needed.   3.  Omeprazole 20 mg by mouth daily.  4.  Vitamin K 5 mg by mouth daily.  5.  Lasix 20 mg by mouth daily.  6.  Cipro 500 mg by mouth twice daily.  7.  Spironolactone 25 mg by mouth daily.  8.  Nadolol 20 mg by mouth 1 tablet once a day.   DIET:  Low sodium diet.   ACTIVITY:  As tolerated.   FOLLOW-UP CARE:  Follow up GI Dr. Bluford Kaufmann within 1 week.  Follow-up with PCP within one week.  Hold spironolactone, Lasix, nadolol if systolic blood pressure less than 100 or diastolic less than 60.  The patient will go to home, will be discharged to home with hospice care.   REASON FOR ADMISSION:  Bloody stool for 6 days.   HOSPITAL COURSE:   1.  The patient is a 64 year old African American male with a history of rectal bleeding, hemorrhoid, hypertension, hepatitis C, alcoholic liver disease with cirrhosis, ascites, coronary artery disease, presented to the ED with bloody stool for 6 days.  The patient's hemoglobin decreased to 9.0 from 10.5 several days ago before this admission.  For detailed history and physical examination, please refer to the admission note dictated by me.  On admission date, patient's WBC 8.8, hemoglobin 9, platelets 201, glucose 119, BUN 10, creatinine 0.96, sodium 130, potassium 4.4, bicarbonate 23.  INR 2.3.  The patient  was admitted for GI bleeding, anemia and ascites, coagulopathy, hyponatremia.  After admission, the patient was kept nothing by mouth with IV fluids with normal saline support.  In addition, for GI bleeding, patient was treated with Protonix IV q. 12 hours, octreotide drip.  We requested a GI consult.  Dr. Mechele Collin suggested endoscopy to rule out esophageal varices and Dr. Bluford Kaufmann did endoscopy which showed esophageal varices which were banded.  After EGD the patient has no active rectal bleeding.  The patient was transfused with PRBC 2 units.  Hemoglobin has been stable after transfusion.  The last hemoglobin was 10.9.  2.  Hypotension.  The patient developed hypotension after we started diuretics, so Lasix, spironolactone was on hold due to hypotension.  The patient's blood pressure has been low at about 90 to 100.  3.  For liver cirrhosis and ascites, the patient has a highly distended abdomen.  Ascites sign is positive.  Abdominal ultrasound showed ascites, moderate to large amount.  The patient underwent paracentesis twice with drainage of 2000 mL and 4700 mL fluid.  Fluid ascites test did not show any infection.  After paracentesis, the patient was treated with albumin.  4.  Coagulopathy.  The patient continues to have an elevated INR above 2, has been treated with vitamin K daily and FFP for paracentesis, but the patient's INR is still high.  The last one was at 2.3.  Coagulopathy is possibly  due to liver cirrhosis.  5.  The patient also underwent sigmoidoscopy which showed hemorrhoid.  After paracentesis, the patient's abdominal distention has much improved.  She has weakness, but has no other complaints, however, patient still has bilateral leg edema.    Palliative care physician Dr. Harvie JuniorPhifer evaluated patient and suggested hospice care.  The patient will be discharged to home with hospice care today.  Discussed the patient's discharge plan with the patient, the case manager and the hospice care staff.    TIME SPENT:  About 46 minutes.    ____________________________ Shaune PollackQing Shyan Scalisi, MD qc:ea D: 08/17/2012 15:05:47 ET T: 08/18/2012 00:00:42 ET JOB#: [id number]  cc: Shaune PollackQing Jesselee Poth, MD, <Dictator> Shaune PollackQING Avinash Maltos MD ELECTRONICALLY SIGNED 08/18/2012 15:33

## 2014-08-08 NOTE — Consult Note (Signed)
Chief Complaint:  Subjective/Chief Complaint seen for rectal bleeding.  denies n/v or abdominal pain.  abdomen continues distension form ascites.  no recurrent rectal bleeding.   VITAL SIGNS/ANCILLARY NOTES: **Vital Signs.:   27-Apr-14 05:49  Vital Signs Type Routine  Temperature Temperature (F) 98.3  Celsius 36.8  Temperature Source oral  Pulse Pulse 89  Respirations Respirations 18  Systolic BP Systolic BP 91  Diastolic BP (mmHg) Diastolic BP (mmHg) 65  Mean BP 73  Pulse Ox % Pulse Ox % 94  Pulse Ox Activity Level  At rest  Oxygen Delivery Room Air/ 21 %    14:05  Vital Signs Type Routine  Temperature Temperature (F) 98.2  Celsius 36.7  Temperature Source oral  Pulse Pulse 81  Respirations Respirations 18  Systolic BP Systolic BP 88  Diastolic BP (mmHg) Diastolic BP (mmHg) 58  Mean BP 68  Pulse Ox % Pulse Ox % 96  Pulse Ox Activity Level  At rest  Oxygen Delivery Room Air/ 21 %   Brief Assessment:  Cardiac Regular   Respiratory clear BS   Gastrointestinal details normal tense ascites, bs positive, non tender   Lab Results: Routine Chem:  27-Apr-14 02:07   Result Comment LABS - This specimen was collected through an   - indwelling catheter or arterial line.  - A minimum of 5mls of blood was wasted prior    - to collecting the sample.  Interpret  - results with caution.  Result(s) reported on 12 Aug 2012 at 04:25AM.  Routine Coag:  27-Apr-14 02:07   Prothrombin  22.7  INR 2.0 (INR reference interval applies to patients on anticoagulant therapy. A single INR therapeutic range for coumarins is not optimal for all indications; however, the suggested range for most indications is 2.0 - 3.0. Exceptions to the INR Reference Range may include: Prosthetic heart valves, acute myocardial infarction, prevention of myocardial infarction, and combinations of aspirin and anticoagulant. The need for a higher or lower target INR must be assessed individually. Reference:  The Pharmacology and Management of the Vitamin K  antagonists: the seventh ACCP Conference on Antithrombotic and Thrombolytic Therapy. Chest.2004 Sept:126 (3suppl): [id number]. A HCT value >55% may artifactually increase the PT.  In one study,  the increase was an average of 25%. Reference:  "Effect on Routine and Special Coagulation Testing Values of Citrate Anticoagulant Adjustment in Patients with High HCT Values." American Journal of Clinical Pathology 2006;126:400-405.)  Routine Hem:  27-Apr-14 02:07   WBC (CBC)  11.4  RBC (CBC)  2.87  Hemoglobin (CBC)  9.8  Hematocrit (CBC)  28.9  Platelet Count (CBC) 189  MCV  101  MCH  34.3  MCHC 34.1  RDW  16.0  Neutrophil % 67.3  Lymphocyte % 20.5  Monocyte % 11.3  Eosinophil % 0.8  Basophil % 0.1  Neutrophil #  7.7  Lymphocyte # 2.3  Monocyte #  1.3  Eosinophil # 0.1  Basophil # 0.0   Assessment/Plan:  Assessment/Plan:  Assessment 1)  childs pugh class c cirrhosis admitted with rectal bleeding.  Patietn had egd with banding.  Some concern for rectal varices, will likely need to have flex sig before d/c.  Paracentesis noted.   Plan 1) agree with repeat paracentesis of 2-3 liters.  will restart low dose diuretics.  2) flex sig when clinically feasible. will discuss further with Dr [name] tomorrow.   Electronic Signatures: Barnetta ChapelSkulskie, Russel Morain (MD)  (Signed 27-Apr-14 14:54)  Authored: Chief Complaint, VITAL SIGNS/ANCILLARY NOTES, Brief Assessment, Lab Results,  Assessment/Plan   Last Updated: 27-Apr-14 14:54 by Barnetta Chapel (MD)

## 2014-08-08 NOTE — Consult Note (Signed)
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  Presented with rectal bleeding.  Patient has h/o rectal bleeding from hemorrhoids.  EGD result noted. Patient denies abd pain, no rectal bleeding for 2-3 days.  poor appetite.   VITAL SIGNS/ANCILLARY NOTES: **Vital Signs.:   26-Apr-14 04:30  Vital Signs Type Routine  Temperature Temperature (F) 98.2  Celsius 36.7  Temperature Source oral  Pulse Pulse 98  Respirations Respirations 18  Systolic BP Systolic BP 108  Diastolic BP (mmHg) Diastolic BP (mmHg) 68  Mean BP 81  Pulse Ox % Pulse Ox % 90  Pulse Ox Activity Level  At rest  Oxygen Delivery Room Air/ 21 %   Brief Assessment:  Cardiac Regular   Respiratory clear BS   Gastrointestinal details normal positive ascites, bs positive/normal, non-tender,   Lab Results: Routine Chem:  26-Apr-14 03:56   Result Comment LABS - This specimen was collected through an   - indwelling catheter or arterial line.  - A minimum of 5mls of blood was wasted prior    - to collecting the sample.  Interpret  - results with caution.  Result(s) reported on 11 Aug 2012 at 04:45AM.  Magnesium, Serum 1.8 (1.8-2.4 THERAPEUTIC RANGE: 4-7 mg/dL TOXIC: > 10 mg/dL  -----------------------)  Routine Coag:  26-Apr-14 03:56   Prothrombin  21.0  INR 1.8 (INR reference interval applies to patients on anticoagulant therapy. A single INR therapeutic range for coumarins is not optimal for all indications; however, the suggested range for most indications is 2.0 - 3.0. Exceptions to the INR Reference Range may include: Prosthetic heart valves, acute myocardial infarction, prevention of myocardial infarction, and combinations of aspirin and anticoagulant. The need for a higher or lower target INR must be assessed individually. Reference: The Pharmacology and Management of the Vitamin K  antagonists: the seventh ACCP Conference on  Antithrombotic and Thrombolytic Therapy. Chest.2004 Sept:126 (3suppl): [id number]. A HCT value >55% may artifactually increase the PT.  In one study,  the increase was an average of 25%. Reference:  "Effect on Routine and Special Coagulation Testing Values of Citrate Anticoagulant Adjustment in Patients with High HCT Values." American Journal of Clinical Pathology 2006;126:400-405.)  Routine Hem:  23-Apr-14 14:04   Hemoglobin (CBC)  9.0    20:53   Hemoglobin (CBC)  9.7 (Result(s) reported on 08 Aug 2012 at 09:14PM.)  24-Apr-14 06:01   Hemoglobin (CBC)  8.9    16:47   Hemoglobin (CBC)  9.2 (Result(s) reported on 09 Aug 2012 at 05:48PM.)  25-Apr-14 00:00   Hemoglobin (CBC)  11.9 (Result(s) reported on 10 Aug 2012 at 12:21AM.)  26-Apr-14 03:56   WBC (CBC)  13.0  RBC (CBC)  2.80  Hemoglobin (CBC)  9.5  Hematocrit (CBC)  27.9  Platelet Count (CBC) 193  MCV 100  MCH 34.0  MCHC 34.0  RDW  16.8  Neutrophil % 73.4  Lymphocyte % 16.3  Monocyte % 9.0  Eosinophil % 0.3  Basophil % 1.0  Neutrophil #  9.5  Lymphocyte # 2.1  Monocyte #  1.2  Eosinophil # 0.0  Basophil # 0.1   Radiology Results: US:    25-Apr-14 14:42, US Guided Paracentesis  US Guided Paracentesis   REASON FOR EXAM:    ascites  COMMENTS:       PROCEDURE: US  - US GUIDED PARACENTESIS  - Aug 10 2012  2:42PM     RESULT:     Procedure: The  patient's surrogate was informed of the risks and benefits   of the procedure and proper informed consent was obtained. The patient   was brought to the Ultrasound Suite and placed in a supine position. The   right lower quadrant was evaluated. A proper entry site for   ultrasound-guided paracentesis was established. The overlying soft   tissues were then prepped and draped in the usual sterile fashion.   Utilizing 8 mL of 1% lidocaine without epinephrine the overlying soft   tissues were anesthetized. A small dermatotomy was performed at the entry     site. The peritoneal  cavity was cannulated with a Saf-T paracentesis   catheter; 2,000 mL of yellow colored fluid was removed from the   peritoneal cavity. This is the patient's initial paracentesis and thus is   only 2,000 mL. If clinically warranted, repeat paracentesis can be   obtained at a later time for further fluid removal.    IMPRESSION:    1. Ultrasound-guided paracentesis as described above. The patient   tolerated the procedure without complications.    Thank you for the opportunity to contribute to the care of your patient.         Verified By: Jani Files, M.D., MD   Assessment/Plan:  Assessment/Plan:  Assessment 1) childs pugh class c cirrhosis from etoh and hepatitis c-coagulopathy, esophageal varices s/p banding.   2) rectal bleeding-concern for possible rectal varices. Patietn stable curretnly, feeling some better after paracentesis.   Plan 1) continue current.  may need to have flexible sigmoidoscopy before d/c to further evaluate for possible rectal varices.  It is of note that apparently patietn had egd adn colonosocpy in early 2012 at [facility] without apparent mention of evidence of portal hypertension.  Would place on low dose vit K po for now, continue nadolol 20 mg po q 5 pm, increase to 40 if tolerated.  will need cipro 500 mg po twice a week for sbp prophylaxis.  If rectal varices are found, TIPSS would be a good proceedure to decrease the portal pressure.  folowing.   Electronic Signatures: Barnetta Chapel (MD)  (Signed 26-Apr-14 12:43)  Authored: Chief Complaint, VITAL SIGNS/ANCILLARY NOTES, Brief Assessment, Lab Results, Radiology Results, Assessment/Plan   Last Updated: 26-Apr-14 12:43 by Barnetta Chapel (MD)

## 2014-08-08 NOTE — Discharge Summary (Signed)
PATIENT NAME:  Jake Cobb, Jake Cobb MR#:  [id number] DATE OF BIRTH:  Jun 06, 1950  DATE OF ADMISSION:  09/17/2012 DATE OF DISCHARGE:  09/19/2012  PRINCIPLE DIAGNOSIS: Intractable ascites.   OTHER DIAGNOSES:  1.  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  2.  Coagulopathy.  3.  Previously banded esophageal varices.  4.  History of gastroesophageal bleeding.  5.  Hyponatremia.  6.  Coronary artery disease.  7.  Hypertension.  8.  Dyslipidemia.  9.  Depression.  10. Anxiety.  11. Posttraumatic stress disorder.  12. Chronic tobacco abuse.  13. History of alcohol abuse.  14. Hemorrhoids.   PRINCIPLE PROCEDURE PERFORMED DURING THIS ADMISSION: Abdominal PleurX catheter placement 09/18/2012.   HOSPITAL COURSE: [name] was admitted to the hospital, given appropriate correction for his coagulopathy and underwent the above-mentioned procedure for the above-mentioned diagnosis. The cell count and differential on the 1 L of fluid that I removed intraoperatively was clean. On postoperative day 1, the patient was afebrile with stable vital signs and his pain was controlled with Norco and his abdomen was not tense and he was therefore discharged home with hospice on the same medications as he was admitted on and in addition was given a prescription for Norco 5/325 mg 1 to 2 q. 4 hours p.r.n. pain. He was asked to make an appointment to see me in 2 weeks for suture removal and it was instructed to drain his peritoneal fluid whenever it became uncomfortable for him.   ____________________________ Claude MangesWilliam F. Brazos Sandoval, MD wfm:aw D: 09/19/2012 10:40:52 ET T: 09/19/2012 10:47:38 ET JOB#: [id number]  cc: Claude MangesWilliam F. Ophie Burrowes, MD, <Dictator> Claude MangesWILLIAM F Valen Gillison MD ELECTRONICALLY SIGNED 09/19/2012 13:09

## 2014-08-08 NOTE — Consult Note (Signed)
Chief Complaint:  Subjective/Chief Complaint Ascites. Unable to perform paracentesis due to coagulapathy. Not responding well to vit K. Diuretics cause low BP. Drank mg citrate yest.   VITAL SIGNS/ANCILLARY NOTES: **Vital Signs.:   30-Apr-14 05:04  Vital Signs Type Routine  Temperature Temperature (F) 98.2  Celsius 36.7  Temperature Source oral  Pulse Pulse 83  Respirations Respirations 18  Systolic BP Systolic BP 97  Diastolic BP (mmHg) Diastolic BP (mmHg) 66  Mean BP 76  Pulse Ox % Pulse Ox % 94  Pulse Ox Activity Level  At rest  Oxygen Delivery Room Air/ 21 %   Brief Assessment:  Cardiac Regular   Respiratory clear BS   Gastrointestinal Ascites. mildly tender   Lab Results: Routine Chem:  30-Apr-14 03:55   Glucose, Serum  128  BUN 11  Creatinine (comp) 0.94  Sodium, Serum  132  Potassium, Serum 4.0  Chloride, Serum 101  CO2, Serum 26  Calcium (Total), Serum  7.4  Anion Gap  5  Osmolality (calc) 266  eGFR (African American) >60  eGFR (Non-African American) >60 (eGFR values <81m/min/1.73 m2 may be an indication of chronic kidney disease (CKD). Calculated eGFR is useful in patients with stable renal function. The eGFR calculation will not be reliable in acutely ill patients when serum creatinine is changing rapidly. It is not useful in  patients on dialysis. The eGFR calculation may not be applicable to patients at the low and high extremes of body sizes, pregnant women, and vegetarians.)  Routine Coag:  30-Apr-14 03:55   Prothrombin  25.2  INR 2.4 (INR reference interval applies to patients on anticoagulant therapy. A single INR therapeutic range for coumarins is not optimal for all indications; however, the suggested range for most indications is 2.0 - 3.0. Exceptions to the INR Reference Range may include: Prosthetic heart valves, acute myocardial infarction, prevention of myocardial infarction, and combinations of aspirin and anticoagulant. The  need for a higher or lower target INR must be assessed individually. Reference: The Pharmacology and Management of the Vitamin K  antagonists: the seventh ACCP Conference on Antithrombotic and Thrombolytic Therapy. CQQPYP.9509Sept:126 (3suppl): [id number] A HCT value >55% may artifactually increase the PT.  In one study,  the increase was an average of 25%. Reference:  "Effect on Routine and Special Coagulation Testing Values of Citrate Anticoagulant Adjustment in Patients with High HCT Values." American Journal of Clinical Pathology 2006;126:400-405.)   Assessment/Plan:  Assessment/Plan:  Assessment Ascites. Liver cirrhosis.   Plan Since we cannot perform paracentesis now, needs diuretics. Will hold nadolol to keep BP up for now. Can try to give concentrated albumin daily to see if this will decrease ascites also. Daily wt. For flex sig later today. Thanks.   Electronic Signatures: OVerdie Shire(MD)  (Signed 30-Apr-14 08:04)  Authored: Chief Complaint, VITAL SIGNS/ANCILLARY NOTES, Brief Assessment, Lab Results, Assessment/Plan   Last Updated: 30-Apr-14 08:04 by OVerdie Shire(MD)

## 2014-08-08 NOTE — Consult Note (Signed)
Flex sig showed NO rectal varices. Prominent ext hemorrhoids, which could have bled recently. Low sodium diet ordered.  Electronic Signatures: Lutricia Feilh, Vanden Fawaz (MD)  (Signed on 30-Apr-14 11:00)  Authored  Last Updated: 30-Apr-14 11:00 by Lutricia Feilh, Vonette Grosso (MD)

## 2014-08-08 NOTE — H&P (Signed)
PATIENT NAME:  Jake Cobb, Jake Cobb MR#:  [id number] DATE OF BIRTH:  19-Mar-1951  DATE OF ADMISSION:  08/08/2012  PRIMARY CARE PHYSICIAN: Nonlocal.  The patient has a PCP in [place].  REFERRING PHYSICIAN:  Daryel NovemberJonathan Williams, MD   CHIEF COMPLAINT: Bloody stool for 6 days.   HISTORY OF PRESENT ILLNESS: The patient is a 64 year old African-American male with a history of rectal bleeding, hemorrhoid, hypertension, hepatitis C, alcohol liver disease with cirrhosis, ascites, CAD, presented to the ED with bloody stool for 6 days. The patient is alert, awake, oriented, in no acute distress. The patient said he has had bloody stool on and off for the past 6 days which is dark and bright, intermittent, sometimes with a blood clot. The patient also feels dizzy and weak, and unable to stand.  The patient came to the ED 4 days ago.  Since hemoglobin was stable, the patient was sent home; but hemoglobin today decreased to 9.0 today from 10.5 four days ago. The patient denies any abdominal pain, nausea, vomiting, but has diarrhea sometimes. In addition, the patient complains of abdominal distention and leg swelling for the past 2 months, but he denies any fever or chills. No dysuria, hematuria, no easy bleeding or bruises.    PAST MEDICAL HISTORY: Rectal bleeding, hemorrhoid, hyperlipidemia, hypertension, anxiety, hepatitis C, cirrhosis, ascites, CAD, COPD.   SOCIAL HISTORY: The patient does not smoke and quit alcohol drinking since last August. He denies any alcohol drinking recently and no drug abuse.   FAMILY HISTORY: Father has CAD and a heart attack. Mother has kidney disease and Alzheimer's dementia.   ALLERGIES: ASPIRIN, DOXYCYCLINE AND SULFA DRUGS.   HOME MEDICATIONS: Ambien 5 mg p.o. once a day at bedtime p.r.n., oxycodone 5 mg p.o. 4 times a day p.r.n., Zofran 4 mg p.o. every 6 hours p.r.n., omeprazole 20 mg p.o. once a day, Lasix 20 mg p.o. daily, Cipro 500 mg p.o. twice a week up, Aldactone 50 mg p.o.  b.i.d. and Mephyton 5 mg p.o. 1 tablet once.    REVIEW OF SYSTEMS:  CONSTITUTIONAL: The patient denies any fever or chills but has a headache, dizziness and generalized weakness.  EYES: No double vision, blurred vision, but has icterus.  HEENT: No epistaxis, postnasal drip or slurred speech, no dysphagia.  CARDIOVASCULAR: No chest pain, palpitation, orthopnea or nocturnal dyspnea but has leg edema.  GASTROINTESTINAL: No abdominal pain, nausea, vomiting, but has diarrhea and bloody stools.  GENITOURINARY: No dysuria, hematuria or incontinence.  SKIN: No rash or jaundice.  HEMATOLOGY: No easy bruising, bleeding.  ENDOCRINOLOGY: No polyuria, polydipsia, heat or cold intolerance.  NEUROLOGY: No syncope, loss of consciousness or seizure.   PHYSICAL EXAMINATION: VITALS: Temperature 97.6, blood pressure 96/74, pulse 80, respirations 18, O2 saturation 100% on oxygen.  GENERAL: The patient is alert, awake, oriented, in no acute distress but very thin.  HEENT: Pupils are round, equal and reactive to light and accommodation. The patient has scleral icterus and pale conjunctiva but no discharge from ear or nose. Moist oral mucosa. Clear oropharynx.  NECK: Supple. No JVD or carotid bruit. No lymphadenopathy. No thyromegaly.  CARDIOVASCULAR: S1, S2 regular rate, rhythm. No murmurs or gallops. PULMONARY: Bilateral air entry. No wheezing or rales. No use of accessory muscles to breathe.  ABDOMEN: Highly distended, diffuse. Mild tenderness. No rigidity. No rebound. Difficult to estimate whether the patient has organomegaly but has positive ascites sign.  EXTREMITIES: Bilateral lower extremity edema, trace to 1+. No clubbing or cyanosis. No calf tenderness. Strong  bilateral pedal pulses.  SKIN: No rashes or jaundice. No bruises.  NEUROLOGY: A and O x 3. No focal deficit. Power 5 out of 5. Sensation intact.   LABORATORY DATA: WBC 8.8, hemoglobin 9.0, hematocrit 27.1, platelets 201.  Glucose 119, BUN 10,  creatinine 0.96, sodium 130, potassium 4.4, chloride 102, bicarb 23. PT 24.9, INR 2.3, PTT 49.6.   IMPRESSION:  1.  Gastrointestinal bleeding.  2.  Anemia.  3.  Ascites.  4.  Coagulopathy.  5.  Hyponatremia.  6.  Liver cirrhosis.  7.  History of hepatitis C.  8.  History of coronary artery disease. 9.  Hypertension.  10.  Chronic obstructive pulmonary disease.  11.  Hyperlipidemia.  12.  Anxiety.   PLAN OF TREATMENT:  1.  Gastrointestinal bleeding: The patient will be admitted to a medical floor. We will keep n.p.o. and give IV normal saline support and start Protonix 40 mg IV q. 12 hours.  Start an octreotide drip and follow up CBC.  We will get a GI consult from Dr. Mechele Collin.  2.  Coagulopathy:  We will give vitamin K and follow-up INR, PT.  3.  Ascites: We will get abdominal ultrasound, but the patient cannot get a paracentesis due to coagulopathy. We will follow up with GI consult.  4.  Deep vein thrombosis prophylaxis with TEDs.   I discussed the patient's condition and the plan of treatment with the patient and the patient's family member.   TIME SPENT: About 58 minutes.   ____________________________ Shaune Pollack, MD qc:cb D: 08/08/2012 16:15:34 ET T: 08/08/2012 17:04:56 ET JOB#: [id number]  cc: Shaune Pollack, MD, <Dictator> Shaune Pollack MD ELECTRONICALLY SIGNED 08/08/2012 22:17

## 2014-08-08 NOTE — Op Note (Signed)
PATIENT NAME:  Jake Cobb, Jake Cobb MR#:  [id number] DATE OF BIRTH:  Jun 26, 1950  DATE OF PROCEDURE:  09/18/2012  OPERATION PERFORMED: PleurX catheter placement.   PREOPERATIVE DIAGNOSIS: Intractable ascites.   POSTOPERATIVE DIAGNOSIS: Intractable ascites.   SURGEON: Claude MangesWilliam F. Devyn Sheerin, MD  ANESTHESIA: General.   PROCEDURE IN DETAIL: The patient was placed supine on the operating room table and prepped and draped in the usual sterile fashion. Intraoperative ultrasound was performed to localize a non-bowel containing space in the left mid abdomen, and the thin wall needle and catheter were placed percutaneously into this space, and a guidewire was introduced. A suitable location in the left upper quadrant was found, and a small incision was made here, and the Pleurx catheter was tunneled from there to the insertion site, and utilizing a Cook introducer and the Seldinger technique, the catheter was guided into the pelvis. Then, 1 liter of fluid was removed, and this was sent for cell count and differential as it was a little bit cloudy, and then the subcutaneous tissue at the insertion site was closed with interrupted 4-0 PDS, and the skin was reapproximated with interrupted 3-0 nylon. A 3-0 nylon was placed at the exit site, and this was also secured to the catheter, and sterile dressings were applied. The patient tolerated the procedure well. There were no complications.   ____________________________ Claude MangesWilliam F. Mcgwire Dasaro, MD wfm:OSi D: 09/18/2012 11:16:40 ET T: 09/18/2012 12:02:40 ET JOB#: [id number]  cc: Claude MangesWilliam F. Bryannah Boston, MD, <Dictator> Ned GraceNancy Phifer, MD Claude MangesWILLIAM F Glorimar Stroope MD ELECTRONICALLY SIGNED 09/18/2012 12:51

## 2014-08-08 NOTE — Consult Note (Signed)
Chief Complaint:  Subjective/Chief Complaint Events of the weekend noted. No further bleeding. Abd tense with ascites causing discomfort. For repeat paracentesis later today.   VITAL SIGNS/ANCILLARY NOTES: **Vital Signs.:   28-Apr-14 09:21  Vital Signs Type Recheck  Systolic BP Systolic BP 96  Diastolic BP (mmHg) Diastolic BP (mmHg) 60  Mean BP 72  BP Source  if not from Vital Sign Device manual; left arm   Brief Assessment:  Cardiac Regular   Respiratory clear BS   Gastrointestinal Signif ascites   Lab Results:  Routine Chem:  28-Apr-14 04:08   Glucose, Serum  112  BUN 11  Creatinine (comp) 1.04  Sodium, Serum  132  Potassium, Serum 3.9  Chloride, Serum 101  CO2, Serum 23  Calcium (Total), Serum  7.6  Anion Gap 8  Osmolality (calc) 265  eGFR (African American) >60  eGFR (Non-African American) >60 (eGFR values <6m/min/1.73 m2 may be an indication of chronic kidney disease (CKD). Calculated eGFR is useful in patients with stable renal function. The eGFR calculation will not be reliable in acutely ill patients when serum creatinine is changing rapidly. It is not useful in  patients on dialysis. The eGFR calculation may not be applicable to patients at the low and high extremes of body sizes, pregnant women, and vegetarians.)  Magnesium, Serum  1.5 (1.8-2.4 THERAPEUTIC RANGE: 4-7 mg/dL TOXIC: > 10 mg/dL  -----------------------)  Routine Hem:  28-Apr-14 04:08   WBC (CBC) 10.1  RBC (CBC)  3.09  Hemoglobin (CBC)  10.9  Hematocrit (CBC)  31.1  Platelet Count (CBC) 176 (Result(s) reported on 13 Aug 2012 at 06:47AM.)  MCV  101  MCH  35.3  MCHC 35.1  RDW  15.8  Bands 2  Segmented Neutrophils 71  Lymphocytes 17  Monocytes 7  Eosinophil 1  Basophil 1  Metamyelocyte 1  NRBC 1  Diff Comment 1 ANISOCYTOSIS  Diff Comment 2 POIKILOCYTOSIS  Diff Comment 3 POLYCHROMASIA  Diff Comment 4 PLTS VARIED IN SIZE  Diff Comment 5 TEARDROP CELLS  Diff Comment 6 SMUDGE  CELLS  Result(s) reported on 13 Aug 2012 at 06:47AM.   Assessment/Plan:  Assessment/Plan:  Assessment Cirrhosis. No further bleeding. S/P esophageal variceal banding.   Plan For repeat paracentesis today. Continue diuretics and nonselective beta blocker. Discussed flex sig with patient. Pt agreed. I am TEC tomorrow. Will plan on Wed afternoon. Make sure to switch diet to clear liquid diet tomorrow so we can bowel prep tomorrow afternoon. Thanks.   Electronic Signatures: OVerdie Shire(MD)  (Signed 28-Apr-14 13:00)  Authored: Chief Complaint, VITAL SIGNS/ANCILLARY NOTES, Brief Assessment, Lab Results, Assessment/Plan   Last Updated: 28-Apr-14 13:00 by OVerdie Shire(MD)
